# Patient Record
Sex: Female | Born: 2000
Health system: Southern US, Community
[De-identification: ages and names within clinical notes are randomized; demographics above are authoritative.]

## PROBLEM LIST (undated history)

## (undated) DIAGNOSIS — J3501 Chronic tonsillitis: Secondary | ICD-10-CM

## (undated) DIAGNOSIS — T7840XA Allergy, unspecified, initial encounter: Secondary | ICD-10-CM

## (undated) DIAGNOSIS — T8859XA Other complications of anesthesia, initial encounter: Secondary | ICD-10-CM

## (undated) DIAGNOSIS — F32A Depression, unspecified: Secondary | ICD-10-CM

## (undated) DIAGNOSIS — T4145XA Adverse effect of unspecified anesthetic, initial encounter: Secondary | ICD-10-CM

## (undated) DIAGNOSIS — Z8489 Family history of other specified conditions: Secondary | ICD-10-CM

## (undated) DIAGNOSIS — F419 Anxiety disorder, unspecified: Secondary | ICD-10-CM

## (undated) HISTORY — DX: Anxiety disorder, unspecified: F41.9

## (undated) HISTORY — DX: Depression, unspecified: F32.A

## (undated) HISTORY — PX: WISDOM TOOTH EXTRACTION: SHX21

## (undated) HISTORY — DX: Allergy, unspecified, initial encounter: T78.40XA

---
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
## 2020-01-07 DIAGNOSIS — J3489 Other specified disorders of nose and nasal sinuses: Secondary | ICD-10-CM | POA: Diagnosis not present

## 2020-01-07 DIAGNOSIS — R519 Headache, unspecified: Secondary | ICD-10-CM | POA: Diagnosis not present

## 2020-01-07 DIAGNOSIS — J029 Acute pharyngitis, unspecified: Secondary | ICD-10-CM | POA: Diagnosis not present

## 2020-01-14 DIAGNOSIS — J301 Allergic rhinitis due to pollen: Secondary | ICD-10-CM | POA: Diagnosis not present

## 2020-01-21 DIAGNOSIS — J301 Allergic rhinitis due to pollen: Secondary | ICD-10-CM | POA: Diagnosis not present

## 2020-01-25 DIAGNOSIS — J029 Acute pharyngitis, unspecified: Secondary | ICD-10-CM | POA: Diagnosis not present

## 2020-02-04 DIAGNOSIS — J301 Allergic rhinitis due to pollen: Secondary | ICD-10-CM | POA: Diagnosis not present

## 2020-02-18 DIAGNOSIS — J301 Allergic rhinitis due to pollen: Secondary | ICD-10-CM | POA: Diagnosis not present

## 2020-03-01 DIAGNOSIS — J301 Allergic rhinitis due to pollen: Secondary | ICD-10-CM | POA: Diagnosis not present

## 2020-03-01 DIAGNOSIS — J019 Acute sinusitis, unspecified: Secondary | ICD-10-CM | POA: Diagnosis not present

## 2020-03-03 DIAGNOSIS — J301 Allergic rhinitis due to pollen: Secondary | ICD-10-CM | POA: Diagnosis not present

## 2020-03-21 DIAGNOSIS — H6593 Unspecified nonsuppurative otitis media, bilateral: Secondary | ICD-10-CM | POA: Diagnosis not present

## 2020-03-24 DIAGNOSIS — J301 Allergic rhinitis due to pollen: Secondary | ICD-10-CM | POA: Diagnosis not present

## 2020-03-31 DIAGNOSIS — J301 Allergic rhinitis due to pollen: Secondary | ICD-10-CM | POA: Diagnosis not present

## 2020-04-01 DIAGNOSIS — H9203 Otalgia, bilateral: Secondary | ICD-10-CM | POA: Diagnosis not present

## 2020-04-01 DIAGNOSIS — H6593 Unspecified nonsuppurative otitis media, bilateral: Secondary | ICD-10-CM | POA: Diagnosis not present

## 2020-04-07 DIAGNOSIS — R399 Unspecified symptoms and signs involving the genitourinary system: Secondary | ICD-10-CM | POA: Diagnosis not present

## 2020-04-19 DIAGNOSIS — M549 Dorsalgia, unspecified: Secondary | ICD-10-CM | POA: Diagnosis not present

## 2020-04-19 DIAGNOSIS — R81 Glycosuria: Secondary | ICD-10-CM | POA: Diagnosis not present

## 2020-04-28 DIAGNOSIS — J301 Allergic rhinitis due to pollen: Secondary | ICD-10-CM | POA: Diagnosis not present

## 2020-05-03 DIAGNOSIS — Z87440 Personal history of urinary (tract) infections: Secondary | ICD-10-CM | POA: Diagnosis not present

## 2020-05-05 DIAGNOSIS — J301 Allergic rhinitis due to pollen: Secondary | ICD-10-CM | POA: Diagnosis not present

## 2020-05-17 DIAGNOSIS — R300 Dysuria: Secondary | ICD-10-CM | POA: Diagnosis not present

## 2020-05-19 DIAGNOSIS — J301 Allergic rhinitis due to pollen: Secondary | ICD-10-CM | POA: Diagnosis not present

## 2020-05-20 DIAGNOSIS — J301 Allergic rhinitis due to pollen: Secondary | ICD-10-CM | POA: Diagnosis not present

## 2020-05-24 DIAGNOSIS — N390 Urinary tract infection, site not specified: Secondary | ICD-10-CM | POA: Diagnosis not present

## 2020-05-24 DIAGNOSIS — Z79899 Other long term (current) drug therapy: Secondary | ICD-10-CM | POA: Diagnosis not present

## 2020-05-24 DIAGNOSIS — R102 Pelvic and perineal pain: Secondary | ICD-10-CM | POA: Diagnosis not present

## 2020-05-26 ENCOUNTER — Ambulatory Visit: Payer: Self-pay

## 2020-05-26 DIAGNOSIS — J301 Allergic rhinitis due to pollen: Secondary | ICD-10-CM | POA: Diagnosis not present

## 2020-05-26 DIAGNOSIS — N390 Urinary tract infection, site not specified: Secondary | ICD-10-CM | POA: Diagnosis not present

## 2020-06-02 DIAGNOSIS — J301 Allergic rhinitis due to pollen: Secondary | ICD-10-CM | POA: Diagnosis not present

## 2020-06-17 DIAGNOSIS — J0100 Acute maxillary sinusitis, unspecified: Secondary | ICD-10-CM | POA: Diagnosis not present

## 2020-06-21 DIAGNOSIS — N946 Dysmenorrhea, unspecified: Secondary | ICD-10-CM | POA: Diagnosis not present

## 2020-06-23 DIAGNOSIS — J301 Allergic rhinitis due to pollen: Secondary | ICD-10-CM | POA: Diagnosis not present

## 2020-06-27 DIAGNOSIS — J309 Allergic rhinitis, unspecified: Secondary | ICD-10-CM | POA: Diagnosis not present

## 2020-07-05 DIAGNOSIS — H6591 Unspecified nonsuppurative otitis media, right ear: Secondary | ICD-10-CM | POA: Diagnosis not present

## 2020-07-05 DIAGNOSIS — J310 Chronic rhinitis: Secondary | ICD-10-CM | POA: Diagnosis not present

## 2020-07-05 DIAGNOSIS — J029 Acute pharyngitis, unspecified: Secondary | ICD-10-CM | POA: Diagnosis not present

## 2020-07-07 DIAGNOSIS — J301 Allergic rhinitis due to pollen: Secondary | ICD-10-CM | POA: Diagnosis not present

## 2020-07-08 DIAGNOSIS — H5213 Myopia, bilateral: Secondary | ICD-10-CM | POA: Diagnosis not present

## 2020-07-14 DIAGNOSIS — J301 Allergic rhinitis due to pollen: Secondary | ICD-10-CM | POA: Diagnosis not present

## 2020-07-21 DIAGNOSIS — J312 Chronic pharyngitis: Secondary | ICD-10-CM | POA: Diagnosis not present

## 2020-07-21 DIAGNOSIS — R070 Pain in throat: Secondary | ICD-10-CM | POA: Diagnosis not present

## 2020-07-27 DIAGNOSIS — J329 Chronic sinusitis, unspecified: Secondary | ICD-10-CM | POA: Diagnosis not present

## 2020-07-28 DIAGNOSIS — J301 Allergic rhinitis due to pollen: Secondary | ICD-10-CM | POA: Diagnosis not present

## 2020-08-01 DIAGNOSIS — H6981 Other specified disorders of Eustachian tube, right ear: Secondary | ICD-10-CM | POA: Diagnosis not present

## 2020-08-04 DIAGNOSIS — J301 Allergic rhinitis due to pollen: Secondary | ICD-10-CM | POA: Diagnosis not present

## 2020-08-16 DIAGNOSIS — J301 Allergic rhinitis due to pollen: Secondary | ICD-10-CM | POA: Diagnosis not present

## 2020-08-18 DIAGNOSIS — J301 Allergic rhinitis due to pollen: Secondary | ICD-10-CM | POA: Diagnosis not present

## 2020-08-19 DIAGNOSIS — R300 Dysuria: Secondary | ICD-10-CM | POA: Diagnosis not present

## 2020-08-19 DIAGNOSIS — R102 Pelvic and perineal pain: Secondary | ICD-10-CM | POA: Diagnosis not present

## 2020-08-19 DIAGNOSIS — J029 Acute pharyngitis, unspecified: Secondary | ICD-10-CM | POA: Diagnosis not present

## 2020-08-19 DIAGNOSIS — J3489 Other specified disorders of nose and nasal sinuses: Secondary | ICD-10-CM | POA: Diagnosis not present

## 2020-09-08 DIAGNOSIS — J301 Allergic rhinitis due to pollen: Secondary | ICD-10-CM | POA: Diagnosis not present

## 2020-09-15 DIAGNOSIS — J301 Allergic rhinitis due to pollen: Secondary | ICD-10-CM | POA: Diagnosis not present

## 2020-09-22 DIAGNOSIS — J301 Allergic rhinitis due to pollen: Secondary | ICD-10-CM | POA: Diagnosis not present

## 2020-10-03 DIAGNOSIS — F419 Anxiety disorder, unspecified: Secondary | ICD-10-CM | POA: Diagnosis not present

## 2020-10-03 DIAGNOSIS — J019 Acute sinusitis, unspecified: Secondary | ICD-10-CM | POA: Diagnosis not present

## 2020-10-13 DIAGNOSIS — J301 Allergic rhinitis due to pollen: Secondary | ICD-10-CM | POA: Diagnosis not present

## 2020-10-25 ENCOUNTER — Ambulatory Visit: Payer: BC Managed Care – PPO | Admitting: Internal Medicine

## 2020-10-25 ENCOUNTER — Encounter: Payer: Self-pay | Admitting: Internal Medicine

## 2020-10-25 ENCOUNTER — Other Ambulatory Visit: Payer: Self-pay

## 2020-10-25 DIAGNOSIS — J309 Allergic rhinitis, unspecified: Secondary | ICD-10-CM | POA: Diagnosis not present

## 2020-10-25 DIAGNOSIS — Z8709 Personal history of other diseases of the respiratory system: Secondary | ICD-10-CM | POA: Diagnosis not present

## 2020-10-25 DIAGNOSIS — Z9089 Acquired absence of other organs: Secondary | ICD-10-CM

## 2020-10-25 NOTE — Progress Notes (Signed)
[facility]  Reason for Consult: Recurrent sore throat and colonization with Pseudomonas aeruginosa Referring Provider: Dr. Erline Hau  Assessment: She has a fairly long history of recurrent sore throat that appears to have been very mild and without significant complications.  She has now colonized with Pseudomonas, most likely due to frequent antibiotic exposure in the past.  I told them that I think this is a case of true, true, unrelated.  She has Pseudomonas in her upper airway but I do not think it is likely to be the cause of her sore throat.  I recommend against future throat cultures unless there was some sign of complicated pharyngitis.  I do not feel that she needs any more treatment for Pseudomonas.  I told her that I am perfectly comfortable completely ignoring Pseudomonas.  She said that she felt a little less anxious following our discussion.  She and her mother are in agreement with this plan.  Plan: No need for further testing or treatment of Pseudomonas  Patient Active Problem List   Diagnosis Date Noted   History of sore throat 10/25/2020    Priority: High   Allergic rhinitis 10/25/2020   Status post tonsillectomy 10/25/2020    Patient's Medications  New Prescriptions   No medications on file  Previous Medications   CETIRIZINE (ZYRTEC ALLERGY) 10 MG TABLET       FLUTICASONE (FLONASE) 50 MCG/ACT NASAL SPRAY    Place 1 spray into both nostrils daily.    METH-HYO-M BL-NA PHOS-PH SAL (URIBEL) 118 MG CAPS    Take 1 capsule (118 mg total) by mouth 4 (four) times daily as needed (dysuria).   MONTELUKAST (SINGULAIR) 10 MG TABLET    Take 10 mg by mouth at bedtime.   OXYBUTYNIN (DITROPAN) 5 MG TABLET    Take 1 tablet (5 mg total) by mouth every 8 (eight) hours as needed for bladder spasms (urinary frequency).   PROBIOTIC PRODUCT (PROBIOTIC PO)    Take by mouth daily.  Modified Medications   No medications on file  Discontinued  Medications   No medications on file    HPI: Erica Scott is a 20 y.o. female with a history of seasonal allergies and frequent sore throat.  She is in with her mother today.  He tells me that starting as a young teenager she had frequent sore throat.  Her mother says that she frequently had throat cultures that were positive for group B strep.  She was frequently treated with amoxicillin clavulanate.  She was finally referred to Dr. Jenne Campus who performed a tonsillectomy in 2019.  They feel that she had definite improvement in her sore throat until last November.  She has had multiple episodes of sore throat since that time.  She saw her PCP last November who did a throat culture which grew Pseudomonas aeruginosa susceptible to all antibiotics tested.  She was treated with ciprofloxacin.  She believes that she had some improvement.  She also tells me that she will frequently has sore throat that she thinks is related to her allergies.  She was seen again by her PCP in April.  Repeat throat culture grew Pseudomonas again and she was treated with a second round of ciprofloxacin.  He saw Dr. Jenne Campus in May.  Her pharyngeal exam was normal at that time.  No acute abnormalities were noted on sinus CT scan.  She was seen by her PCP again last month and a throat  culture grew Pseudomonas a third time.  She just completed ciprofloxacin 2 days ago.  She has had diarrhea and upset stomach each time she was treated with ciprofloxacin.  Review of Systems: Review of Systems  Constitutional:  Negative for chills, diaphoresis and fever.  HENT:  Positive for congestion. Negative for ear pain, sinus pain and sore throat.   Respiratory:  Negative for cough, sputum production and shortness of breath.   Gastrointestinal:  Negative for abdominal pain, diarrhea, nausea and vomiting.  Skin:  Negative for rash.  Psychiatric/Behavioral:  The patient is nervous/anxious.      Past Medical History:  Diagnosis Date    Chronic tonsillitis    Complication of anesthesia    Family history of adverse reaction to anesthesia     Social History   Tobacco Use   Smoking status: Never   Smokeless tobacco: Never  Vaping Use   Vaping Use: Never used  Substance Use Topics   Alcohol use: Never   Drug use: Never    Family History  Problem Relation Age of Onset   Prostate cancer Neg Hx    Bladder Cancer Neg Hx    Kidney cancer Neg Hx    No Known Allergies  OBJECTIVE: Vitals:   10/25/20 1541  BP: 112/78  Pulse: (!) 114  Temp: 98 F (36.7 C)  TempSrc: Oral  SpO2: 98%  Weight: 216 lb (98 kg)   Body mass index is 34.86 kg/m.   Physical Exam Constitutional:      Comments: As soon as I walked in the room she told me that she was quite anxious.  She says that she is worried that I was going to give her bad news.  HENT:     Nose: Nose normal.     Mouth/Throat:     Mouth: Mucous membranes are moist.     Pharynx: Oropharynx is clear. No oropharyngeal exudate or posterior oropharyngeal erythema.  Cardiovascular:     Rate and Rhythm: Normal rate and regular rhythm.     Heart sounds: No murmur heard. Pulmonary:     Effort: Pulmonary effort is normal.     Breath sounds: Normal breath sounds. No wheezing, rhonchi or rales.  Lymphadenopathy:     Head:     Right side of head: No submental, submandibular, preauricular or posterior auricular adenopathy.     Left side of head: No submental, submandibular, preauricular or posterior auricular adenopathy.     Cervical: No cervical adenopathy.    Microbiology: No results found for this or any previous visit (from the past 240 hour(s)).  Cliffton Asters, MD [facility] for Infectious Disease [facility] [phone] pager   [phone] cell 10/25/2020, 4:37 PM

## 2020-11-03 DIAGNOSIS — J301 Allergic rhinitis due to pollen: Secondary | ICD-10-CM | POA: Diagnosis not present

## 2020-11-08 ENCOUNTER — Ambulatory Visit: Payer: BC Managed Care – PPO | Admitting: Internal Medicine

## 2020-11-08 DIAGNOSIS — J301 Allergic rhinitis due to pollen: Secondary | ICD-10-CM | POA: Diagnosis not present

## 2020-11-10 DIAGNOSIS — J301 Allergic rhinitis due to pollen: Secondary | ICD-10-CM | POA: Diagnosis not present

## 2020-11-24 DIAGNOSIS — J301 Allergic rhinitis due to pollen: Secondary | ICD-10-CM | POA: Diagnosis not present

## 2020-12-05 DIAGNOSIS — R350 Frequency of micturition: Secondary | ICD-10-CM | POA: Diagnosis not present

## 2020-12-05 DIAGNOSIS — J029 Acute pharyngitis, unspecified: Secondary | ICD-10-CM | POA: Diagnosis not present

## 2020-12-08 DIAGNOSIS — J301 Allergic rhinitis due to pollen: Secondary | ICD-10-CM | POA: Diagnosis not present

## 2020-12-29 DIAGNOSIS — J301 Allergic rhinitis due to pollen: Secondary | ICD-10-CM | POA: Diagnosis not present

## 2021-01-05 DIAGNOSIS — J301 Allergic rhinitis due to pollen: Secondary | ICD-10-CM | POA: Diagnosis not present

## 2021-01-12 DIAGNOSIS — J301 Allergic rhinitis due to pollen: Secondary | ICD-10-CM | POA: Diagnosis not present

## 2021-01-26 DIAGNOSIS — J301 Allergic rhinitis due to pollen: Secondary | ICD-10-CM | POA: Diagnosis not present

## 2021-02-02 DIAGNOSIS — J301 Allergic rhinitis due to pollen: Secondary | ICD-10-CM | POA: Diagnosis not present

## 2021-02-03 DIAGNOSIS — J309 Allergic rhinitis, unspecified: Secondary | ICD-10-CM | POA: Diagnosis not present

## 2021-02-03 DIAGNOSIS — J301 Allergic rhinitis due to pollen: Secondary | ICD-10-CM | POA: Diagnosis not present

## 2021-02-03 DIAGNOSIS — R070 Pain in throat: Secondary | ICD-10-CM | POA: Diagnosis not present

## 2021-02-16 DIAGNOSIS — J301 Allergic rhinitis due to pollen: Secondary | ICD-10-CM | POA: Diagnosis not present

## 2021-02-23 DIAGNOSIS — J301 Allergic rhinitis due to pollen: Secondary | ICD-10-CM | POA: Diagnosis not present

## 2021-03-06 DIAGNOSIS — J019 Acute sinusitis, unspecified: Secondary | ICD-10-CM | POA: Diagnosis not present

## 2021-03-06 DIAGNOSIS — J309 Allergic rhinitis, unspecified: Secondary | ICD-10-CM | POA: Diagnosis not present

## 2021-03-16 DIAGNOSIS — J301 Allergic rhinitis due to pollen: Secondary | ICD-10-CM | POA: Diagnosis not present

## 2021-03-23 DIAGNOSIS — J301 Allergic rhinitis due to pollen: Secondary | ICD-10-CM | POA: Diagnosis not present

## 2021-04-03 DIAGNOSIS — J301 Allergic rhinitis due to pollen: Secondary | ICD-10-CM | POA: Diagnosis not present

## 2021-04-14 DIAGNOSIS — Z111 Encounter for screening for respiratory tuberculosis: Secondary | ICD-10-CM | POA: Diagnosis not present

## 2021-04-17 DIAGNOSIS — Z111 Encounter for screening for respiratory tuberculosis: Secondary | ICD-10-CM | POA: Diagnosis not present

## 2021-04-19 DIAGNOSIS — J029 Acute pharyngitis, unspecified: Secondary | ICD-10-CM | POA: Diagnosis not present

## 2021-04-21 DIAGNOSIS — M542 Cervicalgia: Secondary | ICD-10-CM | POA: Diagnosis not present

## 2021-04-21 DIAGNOSIS — J019 Acute sinusitis, unspecified: Secondary | ICD-10-CM | POA: Diagnosis not present

## 2021-04-21 DIAGNOSIS — J301 Allergic rhinitis due to pollen: Secondary | ICD-10-CM | POA: Diagnosis not present

## 2021-04-24 DIAGNOSIS — Z111 Encounter for screening for respiratory tuberculosis: Secondary | ICD-10-CM | POA: Diagnosis not present

## 2021-04-24 DIAGNOSIS — J301 Allergic rhinitis due to pollen: Secondary | ICD-10-CM | POA: Diagnosis not present

## 2021-04-26 DIAGNOSIS — Z111 Encounter for screening for respiratory tuberculosis: Secondary | ICD-10-CM | POA: Diagnosis not present

## 2021-05-01 DIAGNOSIS — J301 Allergic rhinitis due to pollen: Secondary | ICD-10-CM | POA: Diagnosis not present

## 2021-05-08 IMAGING — RF DG UGI W/ HIGH DENSITY W/O KUB
12 of 16 series · 15 of 24 positions shown · non-contrast
Comparison: None.

CLINICAL DATA: Chest pain.

EXAM:
UPPER GI SERIES WITHOUT KUB
TECHNIQUE: Routine upper GI series was performed with thin and thick barium.
FLUOROSCOPY TIME:  Fluoroscopy Time:  2 minutes and 36 seconds
Number of Acquired Spot Images: 0

[Series 1: cp_standard · 0.26mm/px · 1 of 92 frames shown (1 of 12)]
[frame 14/92]
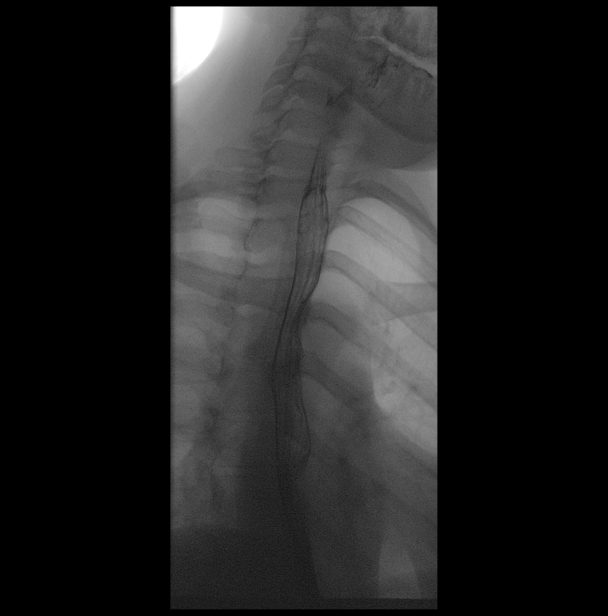

[Series 2: cp_standard · 0.26mm/px · 1 of 51 frames shown (2 of 12)]
[frame 3/51]
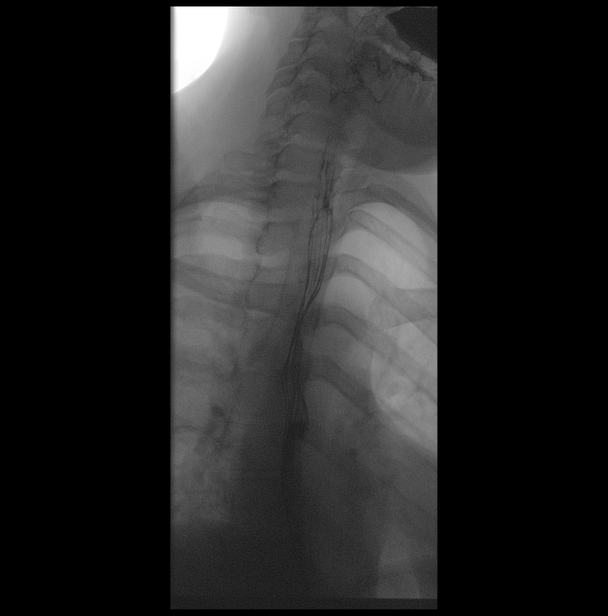

[Series 3: cp_standard · 0.26mm/px · 2 of 74 frames shown (3 of 12)]
[frame 12/74]
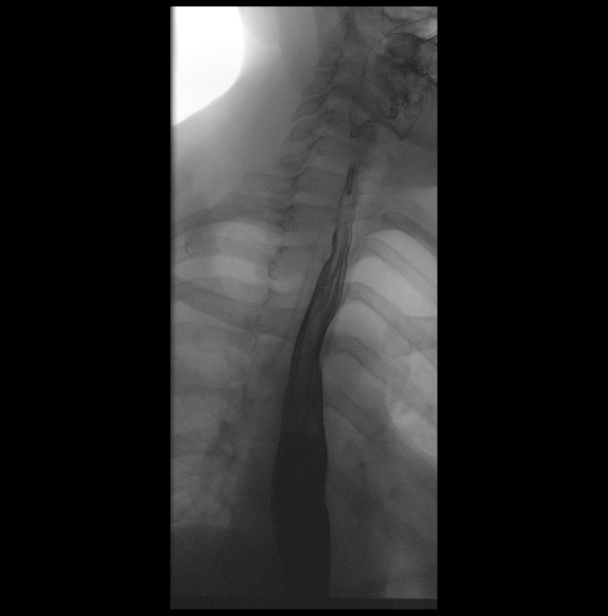
[frame 38/74]
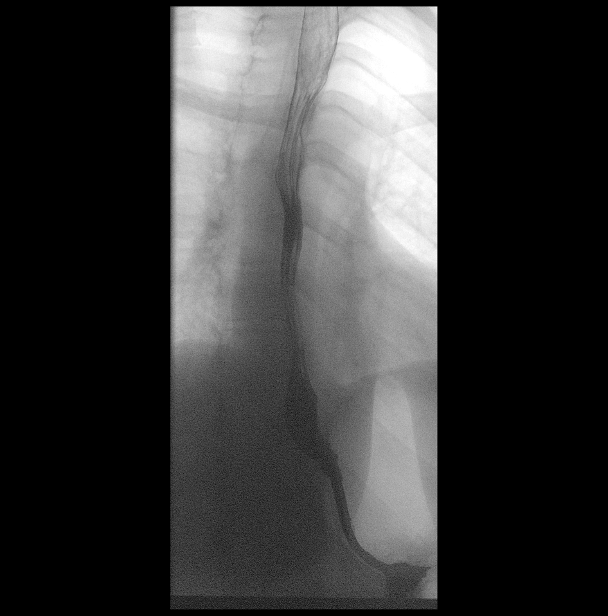

[Series 4: cp_standard · 0.26mm/px · 2 of 65 frames shown (4 of 12)]
[frame 29/65]
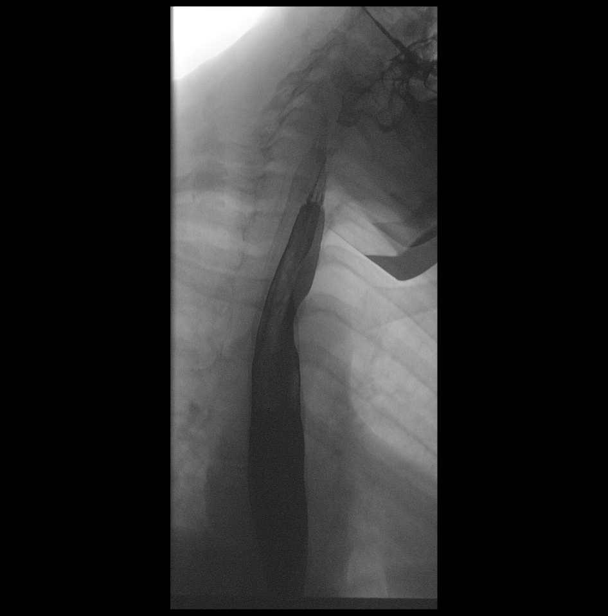
[frame 56/65]
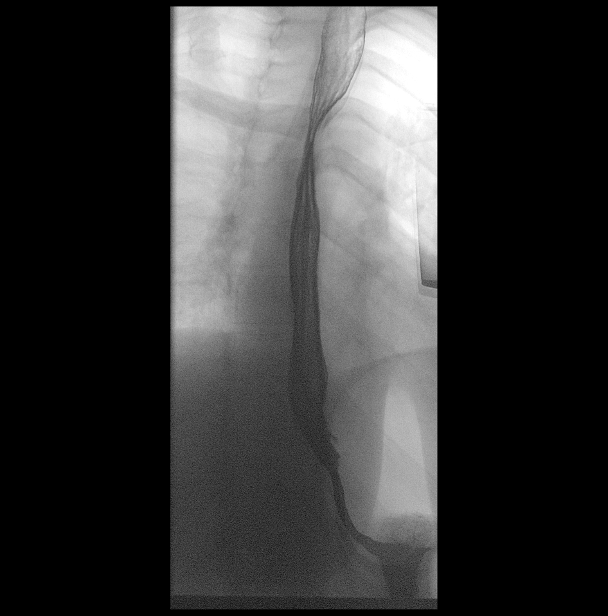

[Series 5: cp_standard · 0.26mm/px · 1 of 35 frames shown (5 of 12)]
[frame 30/35]
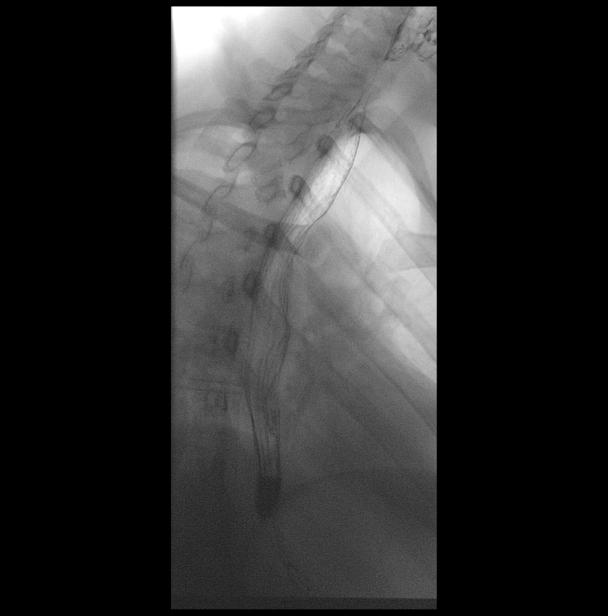

[Series 9: cp_standard · 0.26mm/px · 1 of 1 slices shown (6 of 12)]
[im 1/1]
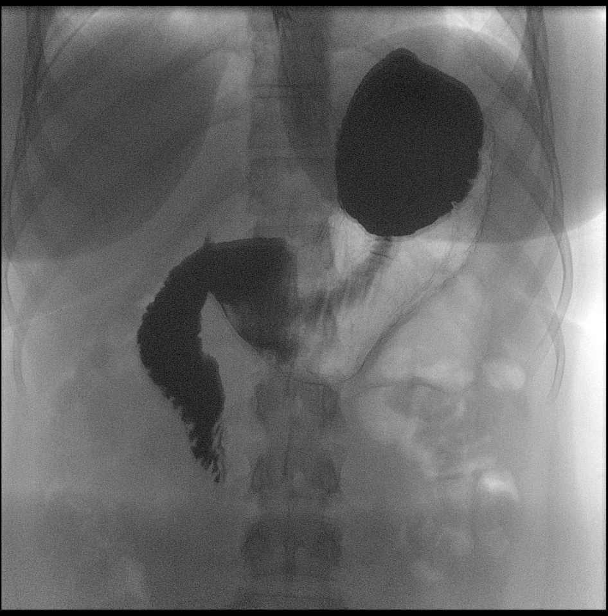

[Series 11: cp_standard · 0.26mm/px · 1 of 1 slices shown (7 of 12)]
[im 1/1]
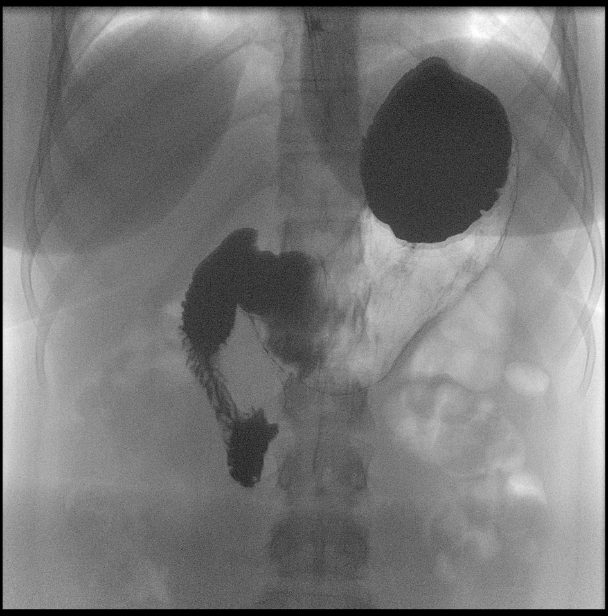

[Series 15: cp_standard · 0.26mm/px · 1 of 1 slices shown (8 of 12)]
[im 1/1]
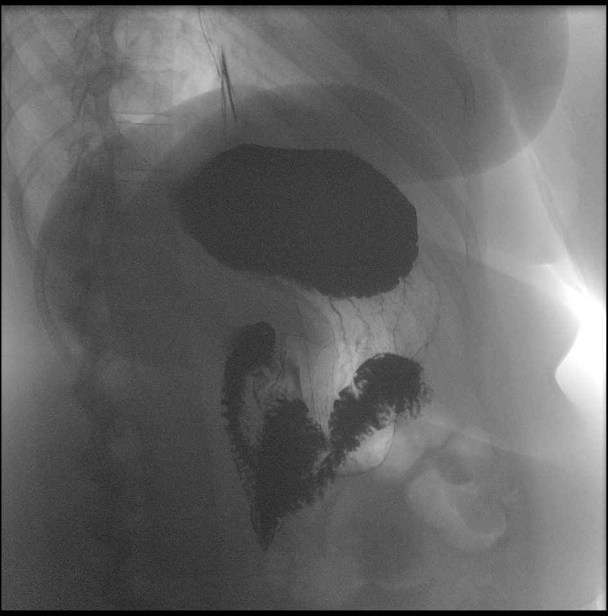

[Series 17: cp_standard · 0.26mm/px · 1 of 1 slices shown (9 of 12)]
[im 1/1]
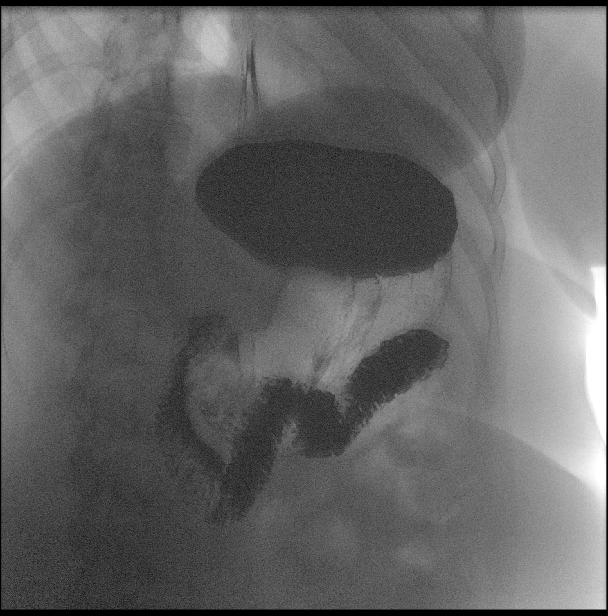

[Series 20: cp_standard · 0.26mm/px · 1 of 1 slices shown (10 of 12)]
[im 1/1]
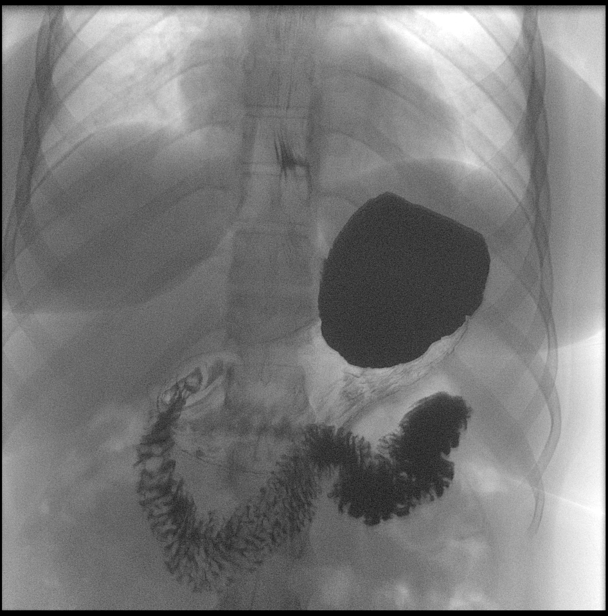

[Series 22: cp_standard · 0.27mm/px · 2 of 109 frames shown (11 of 12)]
[frame 17/109]
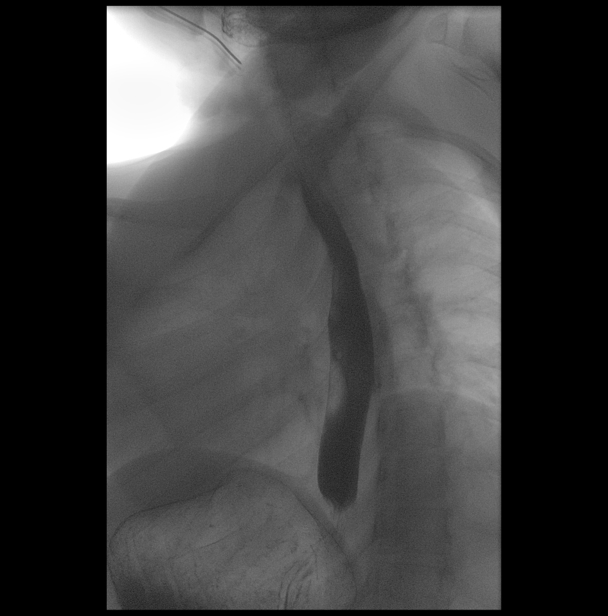
[frame 55/109]
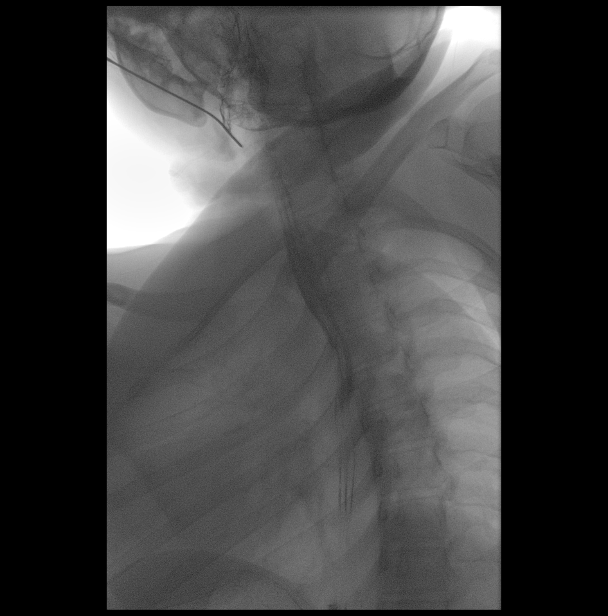

[Series 23: cp_standard · 0.27mm/px · 1 of 95 frames shown (12 of 12)]
[frame 81/95]
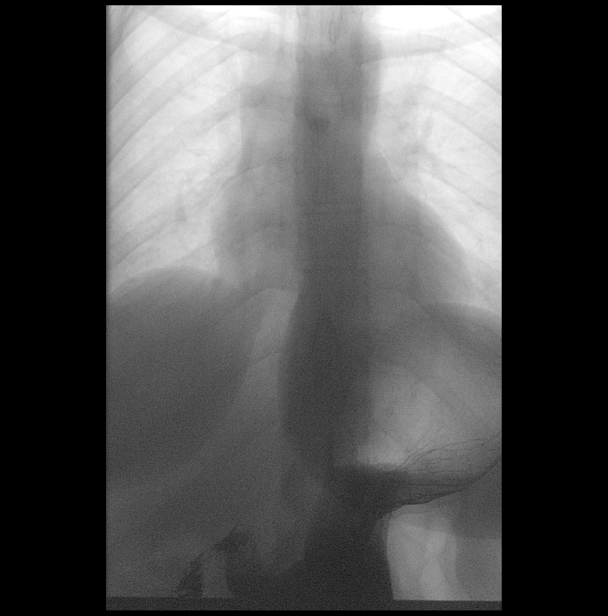

[15 of 24 positions shown; findings below may reference images not displayed]

FINDINGS: The esophagus is normal in appearance. Normal peristalsis. No
masses, strictures, or ulceration identified.

The stomach is normal in appearance with no masses, strictures, or
ulceration. Normal peristalsis is appreciated.

The duodenum is normal in appearance. Specifically, the duodenal
bulb is normal and the duodenal sweep appears as expected.

The barium tablet passed normally.
IMPRESSION: Normal study.

## 2021-05-15 DIAGNOSIS — J329 Chronic sinusitis, unspecified: Secondary | ICD-10-CM | POA: Diagnosis not present

## 2021-05-15 DIAGNOSIS — H65191 Other acute nonsuppurative otitis media, right ear: Secondary | ICD-10-CM | POA: Diagnosis not present

## 2021-05-22 DIAGNOSIS — J301 Allergic rhinitis due to pollen: Secondary | ICD-10-CM | POA: Diagnosis not present

## 2021-05-29 DIAGNOSIS — J301 Allergic rhinitis due to pollen: Secondary | ICD-10-CM | POA: Diagnosis not present

## 2021-06-05 DIAGNOSIS — J301 Allergic rhinitis due to pollen: Secondary | ICD-10-CM | POA: Diagnosis not present

## 2021-06-12 DIAGNOSIS — H60311 Diffuse otitis externa, right ear: Secondary | ICD-10-CM | POA: Diagnosis not present

## 2021-06-14 DIAGNOSIS — H5213 Myopia, bilateral: Secondary | ICD-10-CM | POA: Diagnosis not present

## 2021-06-19 DIAGNOSIS — J301 Allergic rhinitis due to pollen: Secondary | ICD-10-CM | POA: Diagnosis not present

## 2021-07-03 DIAGNOSIS — J301 Allergic rhinitis due to pollen: Secondary | ICD-10-CM | POA: Diagnosis not present

## 2021-07-14 DIAGNOSIS — J301 Allergic rhinitis due to pollen: Secondary | ICD-10-CM | POA: Diagnosis not present

## 2021-07-17 DIAGNOSIS — J301 Allergic rhinitis due to pollen: Secondary | ICD-10-CM | POA: Diagnosis not present

## 2021-07-24 DIAGNOSIS — J301 Allergic rhinitis due to pollen: Secondary | ICD-10-CM | POA: Diagnosis not present

## 2021-07-31 DIAGNOSIS — J301 Allergic rhinitis due to pollen: Secondary | ICD-10-CM | POA: Diagnosis not present

## 2021-08-07 DIAGNOSIS — J301 Allergic rhinitis due to pollen: Secondary | ICD-10-CM | POA: Diagnosis not present

## 2021-08-16 DIAGNOSIS — R300 Dysuria: Secondary | ICD-10-CM | POA: Diagnosis not present

## 2021-08-16 DIAGNOSIS — H65191 Other acute nonsuppurative otitis media, right ear: Secondary | ICD-10-CM | POA: Diagnosis not present

## 2021-08-30 ENCOUNTER — Ambulatory Visit: Payer: BC Managed Care – PPO | Admitting: Internal Medicine

## 2021-09-04 DIAGNOSIS — J301 Allergic rhinitis due to pollen: Secondary | ICD-10-CM | POA: Diagnosis not present

## 2021-09-08 DIAGNOSIS — R300 Dysuria: Secondary | ICD-10-CM | POA: Diagnosis not present

## 2021-09-08 DIAGNOSIS — H9201 Otalgia, right ear: Secondary | ICD-10-CM | POA: Diagnosis not present

## 2021-09-11 DIAGNOSIS — J301 Allergic rhinitis due to pollen: Secondary | ICD-10-CM | POA: Diagnosis not present

## 2021-09-25 DIAGNOSIS — R300 Dysuria: Secondary | ICD-10-CM | POA: Diagnosis not present

## 2021-10-02 ENCOUNTER — Telehealth: Payer: Self-pay

## 2021-10-02 ENCOUNTER — Ambulatory Visit: Payer: BC Managed Care – PPO | Admitting: Internal Medicine

## 2021-10-02 DIAGNOSIS — J301 Allergic rhinitis due to pollen: Secondary | ICD-10-CM | POA: Diagnosis not present

## 2021-10-02 NOTE — Progress Notes (Deleted)
          Patient Active Problem List   Diagnosis Date Noted   Allergic rhinitis 10/25/2020   History of sore throat 10/25/2020   Status post tonsillectomy 10/25/2020    Patient's Medications  New Prescriptions   No medications on file  Previous Medications   CETIRIZINE (ZYRTEC ALLERGY) 10 MG TABLET       FLUTICASONE (FLONASE) 50 MCG/ACT NASAL SPRAY    Place 1 spray into both nostrils daily.    METH-HYO-M BL-NA PHOS-PH SAL (URIBEL) 118 MG CAPS    Take 1 capsule (118 mg total) by mouth 4 (four) times daily as needed (dysuria).   MONTELUKAST (SINGULAIR) 10 MG TABLET    Take 10 mg by mouth at bedtime.   OXYBUTYNIN (DITROPAN) 5 MG TABLET    Take 1 tablet (5 mg total) by mouth every 8 (eight) hours as needed for bladder spasms (urinary frequency).   PROBIOTIC PRODUCT (PROBIOTIC PO)    Take by mouth daily.  Modified Medications   No medications on file  Discontinued Medications   No medications on file    Subjective: 21 year old female followed by Dr. 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  She has had sore throat starting as a teenager.  Frequently positive for group B strep.  Treated with Augmentin.  She was referred to Dr. Jenne Campus in 2019 for tonsillectomy.  She had improvement in sore throat symptoms until November 2021 when she had multiple episodes of sore throat.  She was administered about 3 rounds of ciprofloxacin for throat cultures growing Pseudomonas.  With her last round in August 2022 she developed diarrhea and upset stomach.  Counseled that her throat is likely colonized and as such does not need any further throat culture/treatment unless she has signs of complicated pharyngitis.   Review of Systems: ROS  Past Medical History:  Diagnosis Date   Chronic tonsillitis    Complication of anesthesia    Family history of adverse reaction to anesthesia     Social History   Tobacco Use   Smoking status: Never    Smokeless tobacco: Never  Vaping Use   Vaping Use: Never used  Substance Use Topics   Alcohol use: Never   Drug use: Never    Family History  Problem Relation Age of Onset   Prostate cancer Neg Hx    Bladder Cancer Neg Hx    Kidney cancer Neg Hx     No Known Allergies  Health Maintenance  Topic Date Due   HPV VACCINES (1 - 2-dose series) Never done   HIV Screening  Never done   Hepatitis C Screening  Never done   TETANUS/TDAP  Never done   COVID-19 Vaccine (5 - Pfizer series) 03/07/2020   INFLUENZA VACCINE  10/17/2021    Objective:  There were no vitals filed for this visit. There is no height or weight on file to calculate BMI.  Physical Exam  Lab Results No results found for: "WBC", "HGB", "HCT", "MCV", "PLT" No results found for: "CREATININE", "BUN", "NA", "K", "CL", "CO2" No results found for: "ALT", "AST", "GGT", "ALKPHOS", "BILITOT"  No results found for: "CHOL", "HDL", "LDLCALC", "LDLDIRECT", "TRIG", "CHOLHDL" No results found for: "LABRPR", "RPRTITER" No results found for: "HIV1RNAQUANT", "HIV1RNAVL", "CD4TABS"   #Sore throat #PsA colonization    Danelle Earthly, MD [facility] 10/02/2021, 1:17 PM

## 2021-10-02 NOTE — Telephone Encounter (Signed)
Called patient to reschedule missed appointment. No answer. Left HIPAA-compliant voicemail requesting call back. Will also send MyChart message.  Eldwin Volkov E Delsa Walder, RN  

## 2021-10-09 DIAGNOSIS — J301 Allergic rhinitis due to pollen: Secondary | ICD-10-CM | POA: Diagnosis not present

## 2021-10-16 DIAGNOSIS — J301 Allergic rhinitis due to pollen: Secondary | ICD-10-CM | POA: Diagnosis not present

## 2021-10-18 DIAGNOSIS — H5213 Myopia, bilateral: Secondary | ICD-10-CM | POA: Diagnosis not present

## 2021-10-23 DIAGNOSIS — J301 Allergic rhinitis due to pollen: Secondary | ICD-10-CM | POA: Diagnosis not present

## 2021-10-30 DIAGNOSIS — J301 Allergic rhinitis due to pollen: Secondary | ICD-10-CM | POA: Diagnosis not present

## 2021-11-06 DIAGNOSIS — R300 Dysuria: Secondary | ICD-10-CM | POA: Diagnosis not present

## 2021-11-13 DIAGNOSIS — J301 Allergic rhinitis due to pollen: Secondary | ICD-10-CM | POA: Diagnosis not present

## 2021-11-21 DIAGNOSIS — N946 Dysmenorrhea, unspecified: Secondary | ICD-10-CM | POA: Diagnosis not present

## 2021-11-21 DIAGNOSIS — R300 Dysuria: Secondary | ICD-10-CM | POA: Diagnosis not present

## 2021-11-27 DIAGNOSIS — J301 Allergic rhinitis due to pollen: Secondary | ICD-10-CM | POA: Diagnosis not present

## 2021-12-11 DIAGNOSIS — R300 Dysuria: Secondary | ICD-10-CM | POA: Diagnosis not present

## 2021-12-11 DIAGNOSIS — J301 Allergic rhinitis due to pollen: Secondary | ICD-10-CM | POA: Diagnosis not present

## 2021-12-18 DIAGNOSIS — J301 Allergic rhinitis due to pollen: Secondary | ICD-10-CM | POA: Diagnosis not present

## 2021-12-25 DIAGNOSIS — J301 Allergic rhinitis due to pollen: Secondary | ICD-10-CM | POA: Diagnosis not present

## 2021-12-27 DIAGNOSIS — J301 Allergic rhinitis due to pollen: Secondary | ICD-10-CM | POA: Diagnosis not present

## 2021-12-29 DIAGNOSIS — Z6831 Body mass index (BMI) 31.0-31.9, adult: Secondary | ICD-10-CM | POA: Diagnosis not present

## 2021-12-29 DIAGNOSIS — R300 Dysuria: Secondary | ICD-10-CM | POA: Diagnosis not present

## 2022-01-05 DIAGNOSIS — N390 Urinary tract infection, site not specified: Secondary | ICD-10-CM | POA: Diagnosis not present

## 2022-01-08 DIAGNOSIS — J301 Allergic rhinitis due to pollen: Secondary | ICD-10-CM | POA: Diagnosis not present

## 2022-01-16 DIAGNOSIS — N6002 Solitary cyst of left breast: Secondary | ICD-10-CM | POA: Diagnosis not present

## 2022-01-16 DIAGNOSIS — R102 Pelvic and perineal pain: Secondary | ICD-10-CM | POA: Diagnosis not present

## 2022-01-16 DIAGNOSIS — N946 Dysmenorrhea, unspecified: Secondary | ICD-10-CM | POA: Diagnosis not present

## 2022-01-16 DIAGNOSIS — N939 Abnormal uterine and vaginal bleeding, unspecified: Secondary | ICD-10-CM | POA: Diagnosis not present

## 2022-01-22 DIAGNOSIS — E119 Type 2 diabetes mellitus without complications: Secondary | ICD-10-CM | POA: Insufficient documentation

## 2022-01-22 DIAGNOSIS — B3731 Acute candidiasis of vulva and vagina: Secondary | ICD-10-CM | POA: Diagnosis not present

## 2022-01-22 DIAGNOSIS — N3000 Acute cystitis without hematuria: Secondary | ICD-10-CM | POA: Diagnosis not present

## 2022-01-29 DIAGNOSIS — J301 Allergic rhinitis due to pollen: Secondary | ICD-10-CM | POA: Diagnosis not present

## 2022-02-01 DIAGNOSIS — N6002 Solitary cyst of left breast: Secondary | ICD-10-CM | POA: Diagnosis not present

## 2022-02-01 DIAGNOSIS — N6322 Unspecified lump in the left breast, upper inner quadrant: Secondary | ICD-10-CM | POA: Diagnosis not present

## 2022-02-05 DIAGNOSIS — N6012 Diffuse cystic mastopathy of left breast: Secondary | ICD-10-CM | POA: Diagnosis not present

## 2022-02-05 DIAGNOSIS — J301 Allergic rhinitis due to pollen: Secondary | ICD-10-CM | POA: Diagnosis not present

## 2022-02-07 DIAGNOSIS — N939 Abnormal uterine and vaginal bleeding, unspecified: Secondary | ICD-10-CM | POA: Diagnosis not present

## 2022-02-07 DIAGNOSIS — N946 Dysmenorrhea, unspecified: Secondary | ICD-10-CM | POA: Diagnosis not present

## 2022-02-12 DIAGNOSIS — J301 Allergic rhinitis due to pollen: Secondary | ICD-10-CM | POA: Diagnosis not present

## 2022-02-19 DIAGNOSIS — N6325 Unspecified lump in the left breast, overlapping quadrants: Secondary | ICD-10-CM | POA: Insufficient documentation

## 2022-02-19 DIAGNOSIS — J302 Other seasonal allergic rhinitis: Secondary | ICD-10-CM | POA: Insufficient documentation

## 2022-02-19 DIAGNOSIS — N946 Dysmenorrhea, unspecified: Secondary | ICD-10-CM | POA: Insufficient documentation

## 2022-02-19 DIAGNOSIS — N6320 Unspecified lump in the left breast, unspecified quadrant: Secondary | ICD-10-CM | POA: Diagnosis not present

## 2022-02-19 DIAGNOSIS — N6322 Unspecified lump in the left breast, upper inner quadrant: Secondary | ICD-10-CM | POA: Diagnosis not present

## 2022-02-27 ENCOUNTER — Ambulatory Visit
Admission: RE | Admit: 2022-02-27 | Discharge: 2022-02-27 | Disposition: A | Discharge status: Home / Self Care | Payer: BC Managed Care – PPO | Source: Ambulatory Visit | Attending: Internal Medicine | Admitting: Internal Medicine

## 2022-02-27 ENCOUNTER — Telehealth: Payer: Self-pay

## 2022-02-27 VITALS — BP 138/84 | HR 118 | Temp 98.4°F | Resp 16 | Ht 67.0 in | Wt 200.0 lb

## 2022-02-27 DIAGNOSIS — R300 Dysuria: Secondary | ICD-10-CM | POA: Diagnosis not present

## 2022-02-27 LAB — URINALYSIS, COMPLETE (UACMP) WITH MICROSCOPIC
Bilirubin Urine: NEGATIVE
Glucose, UA: NEGATIVE mg/dL
Hgb urine dipstick: NEGATIVE
Ketones, ur: NEGATIVE mg/dL
Leukocytes,Ua: NEGATIVE
Nitrite: NEGATIVE
Protein, ur: NEGATIVE mg/dL
Specific Gravity, Urine: 1.02 (ref 1.005–1.030)
pH: 7.5 (ref 5.0–8.0)

## 2022-02-27 MED ORDER — SULFAMETHOXAZOLE-TRIMETHOPRIM 800-160 MG PO TABS: 1.0000 | ORAL_TABLET | Freq: Two times a day (BID) | ORAL | 0 refills | Status: DC

## 2022-02-27 MED ORDER — NITROFURANTOIN MONOHYD MACRO 100 MG PO CAPS: 100.0000 mg | ORAL_CAPSULE | Freq: Two times a day (BID) | ORAL | 0 refills | Status: DC

## 2022-02-27 NOTE — Discharge Instructions (Addendum)
We will call you if the urine culture comes  back and shows we need to make changes to your medication.

## 2022-02-27 NOTE — Telephone Encounter (Signed)
S/w [facility] to cancel Bactroban rx that was sent by Dreama Saa PA-C on 02/27/22 & to fill Macrobid 100 mg for pt to start 1 cap po x2 daily.

## 2022-02-27 NOTE — ED Triage Notes (Signed)
Pt c/o urinary frequency, burning, hematuria ongoing x1 wk. Pt has tried AZO otc w/minor relief. Has hx of UTI. Pt denies any fevers or back pain.

## 2022-02-27 NOTE — ED Provider Notes (Signed)
[facility]    CSN: [id number] Arrival date & time: 02/27/22  1526      History   Chief Complaint Chief Complaint  Patient presents with   Urinary Frequency    Entered by patient    HPI Erica Scott is a 21 y.o. female who presents with hx of dysuria, hematuria and frequency x 1 week. Has been taking Azo. Has hx of frequent UTI's. Denies fever, flank pain.  Has had low suspicious UA's but the culture grew klebsiella 2 years ago. She states lately she has grown a different bacterial susceptible to Macrobid and this is what her PCP treats her with for 7-10 days. Her last UTI was 4 months ago.    Past Medical History:  Diagnosis Date   Chronic tonsillitis    Complication of anesthesia    Family history of adverse reaction to anesthesia     Patient Active Problem List   Diagnosis Date Noted   Allergic rhinitis 10/25/2020   History of sore throat 10/25/2020   Status post tonsillectomy 10/25/2020    Past Surgical History:  Procedure Laterality Date   TONSILLECTOMY AND ADENOIDECTOMY N/A 10/25/2017   Procedure: TONSILLECTOMY AND ADENOIDECTOMY;  Surgeon: Linus Salmons, MD;  Location: [facility];  Service: ENT;  Laterality: N/A;   WISDOM TOOTH EXTRACTION      OB History   No obstetric history on file.      Home Medications    Prior to Admission medications   Medication Sig Start Date End Date Taking? Authorizing Provider  fluticasone (FLONASE) 50 MCG/ACT nasal spray Place 1 spray into both nostrils daily.  01/26/19  Yes [provider]  montelukast (SINGULAIR) 10 MG tablet Take 10 mg by mouth at bedtime.   Yes [provider]  nitrofurantoin, macrocrystal-monohydrate, (MACROBID) 100 MG capsule Take 1 capsule (100 mg total) by mouth 2 (two) times daily. 02/27/22  Yes Rodriguez-Southworth, Nettie Elm, PA-C  Probiotic Product (PROBIOTIC PO) Take by mouth daily.   Yes [provider]  Meth-Hyo-M Bl-Na Phos-Ph Sal (URIBEL)  118 MG CAPS Take 1 capsule (118 mg total) by mouth 4 (four) times daily as needed (dysuria). 11/17/19   Vanna Scotland, MD  oxybutynin (DITROPAN) 5 MG tablet Take 1 tablet (5 mg total) by mouth every 8 (eight) hours as needed for bladder spasms (urinary frequency). 11/17/19   Vanna Scotland, MD    Family History Family History  Problem Relation Age of Onset   Prostate cancer Neg Hx    Bladder Cancer Neg Hx    Kidney cancer Neg Hx     Social History Social History   Tobacco Use   Smoking status: Never   Smokeless tobacco: Never  Vaping Use   Vaping Use: Never used  Substance Use Topics   Alcohol use: Never   Drug use: Never     Allergies   Patient has no known allergies.   Review of Systems Review of Systems  Constitutional:  Negative for fever.  Gastrointestinal:  Negative for constipation.  Genitourinary:  Positive for dysuria, frequency and hematuria. Negative for flank pain.  Musculoskeletal:  Positive for back pain.       On R lumbar area     Physical Exam Triage Vital Signs ED Triage Vitals  Enc Vitals Group     BP 02/27/22 1601 138/84     Pulse Rate 02/27/22 1601 (!) 118     Resp 02/27/22 1601 16     Temp 02/27/22 1601 98.4 F (36.9 C)  Temp Source 02/27/22 1601 Oral     SpO2 02/27/22 1601 100 %     Weight 02/27/22 1600 200 lb (90.7 kg)     Height 02/27/22 1600 5\' 7"  (1.702 m)     Head Circumference --      Peak Flow --      Pain Score 02/27/22 1600 0     Pain Loc --      Pain Edu? --      Excl. in [place]? --    No data found.  Updated Vital Signs BP 138/84 (BP Location: Left Arm)   Pulse (!) 118   Temp 98.4 F (36.9 C) (Oral)   Resp 16   Ht 5\' 7"  (1.702 m)   Wt 200 lb (90.7 kg)   LMP 02/10/2022 (Exact Date)   SpO2 100%   BMI 31.32 kg/m   Visual Acuity Right Eye Distance:   Left Eye Distance:   Bilateral Distance:    Right Eye Near:   Left Eye Near:    Bilateral Near:      Physical Exam Vitals and nursing note reviewed.   Constitutional:      General: She is not in acute distress.    Appearance: She is not toxic-appearing.  HENT:     Head: Normocephalic.     Right Ear: External ear normal.     Left Ear: External ear normal.  Eyes:     General: No scleral icterus.    Conjunctiva/sclera: Conjunctivae normal.  Pulmonary:     Effort: Pulmonary effort is normal.  Abdominal:     General: Bowel sounds are normal.     Palpations: Abdomen is soft. There is no mass.     Tenderness: There is no guarding or rebound.     Comments: - CVA tenderness   Musculoskeletal:        General: Normal range of motion.     Cervical back: Neck supple.     Comments: BACK- has muscular tenderness on area of complaint with palpation  Skin:    General: Skin is warm and dry.     Findings: No rash.  Neurological:     Mental Status: She is alert and oriented to person, place, and time.     Gait: Gait normal.  Psychiatric:        Mood and Affect: Mood normal.        Behavior: Behavior normal.        Thought Content: Thought content normal.        Judgment: Judgment normal.    UC Treatments / Results  Labs (all labs ordered are listed, but only abnormal results are displayed) Labs Reviewed  URINALYSIS, COMPLETE (UACMP) WITH MICROSCOPIC - Abnormal; Notable for the following components:      Result Value   Bacteria, UA FEW (*)    All other components within normal limits  URINE CULTURE    EKG   Radiology No results found.  Procedures Procedures (including critical care time)  Medications Ordered in UC Medications - No data to display  Initial Impression / Assessment and Plan / UC Course  I have reviewed the triage vital signs and the nursing notes.  Pertinent labs  results that were available during my care of the patient were reviewed by me and considered in my medical decision making (see chart for details).  UTI- recurrent  I d/c the bactrim I had sent, and sent Macrobid as noted.  The urine was sent  for a culture  and we will call pt if we need to make any changes to her medication.  Final Clinical Impressions(s) / UC Diagnoses   Final diagnoses:  Dysuria     Discharge Instructions      We will call you if the urine culture comes  back and shows we need to make changes to your medication.      ED Prescriptions     Medication Sig Dispense Auth. Provider   sulfamethoxazole-trimethoprim (BACTRIM DS) 800-160 MG tablet  (Status: Discontinued) Take 1 tablet by mouth 2 (two) times daily for 7 days. 14 tablet Rodriguez-Southworth, Ulah Olmo, PA-C   nitrofurantoin, macrocrystal-monohydrate, (MACROBID) 100 MG capsule Take 1 capsule (100 mg total) by mouth 2 (two) times daily. 20 capsule Rodriguez-Southworth, Nettie Elm, PA-C      PDMP not reviewed this encounter.   Garey Ham, [place] 02/27/22 1647

## 2022-03-01 DIAGNOSIS — N6325 Unspecified lump in the left breast, overlapping quadrants: Secondary | ICD-10-CM | POA: Diagnosis not present

## 2022-03-01 DIAGNOSIS — N641 Fat necrosis of breast: Secondary | ICD-10-CM | POA: Diagnosis not present

## 2022-03-01 DIAGNOSIS — N6322 Unspecified lump in the left breast, upper inner quadrant: Secondary | ICD-10-CM | POA: Diagnosis not present

## 2022-03-01 LAB — URINE CULTURE: Culture: 10000 — AB

## 2022-03-05 DIAGNOSIS — J301 Allergic rhinitis due to pollen: Secondary | ICD-10-CM | POA: Diagnosis not present

## 2022-03-21 DIAGNOSIS — J301 Allergic rhinitis due to pollen: Secondary | ICD-10-CM | POA: Diagnosis not present

## 2022-04-02 ENCOUNTER — Ambulatory Visit
Admission: RE | Admit: 2022-04-02 | Discharge: 2022-04-02 | Disposition: A | Discharge status: Home / Self Care | Payer: BC Managed Care – PPO | Source: Ambulatory Visit | Attending: Internal Medicine | Admitting: Internal Medicine

## 2022-04-02 ENCOUNTER — Other Ambulatory Visit: Payer: Self-pay

## 2022-04-02 VITALS — BP 140/86 | HR 99 | Temp 98.6°F | Resp 20

## 2022-04-02 DIAGNOSIS — R300 Dysuria: Secondary | ICD-10-CM

## 2022-04-02 LAB — POCT URINALYSIS DIP (MANUAL ENTRY)
Bilirubin, UA: NEGATIVE
Blood, UA: NEGATIVE
Glucose, UA: NEGATIVE mg/dL
Ketones, POC UA: NEGATIVE mg/dL
Leukocytes, UA: NEGATIVE
Nitrite, UA: NEGATIVE
Protein Ur, POC: NEGATIVE mg/dL
Spec Grav, UA: <=1.005 — AB (ref 1.010–1.025)
Urobilinogen, UA: 0.2 E.U./dL
pH, UA: 6 (ref 5.0–8.0)

## 2022-04-02 MED ORDER — NITROFURANTOIN MONOHYD MACRO 100 MG PO CAPS: 100.0000 mg | ORAL_CAPSULE | Freq: Two times a day (BID) | ORAL | 0 refills | Status: DC

## 2022-04-02 NOTE — ED Triage Notes (Signed)
Symptoms started  1 to 1 1/2 weeks ago.  Patient has burning with urination, pelvic pain, urinary frequency. History of uti.

## 2022-04-02 NOTE — Discharge Instructions (Signed)
Follow up here or with your primary care provider if your symptoms are worsening or not improving.     

## 2022-04-02 NOTE — ED Provider Notes (Signed)
Erica Scott    CSN: [id number] Arrival date & time: 04/02/22  1457      History   Chief Complaint Chief Complaint  Patient presents with   Urinary Frequency    Urinary tract infection symptoms - Entered by patient   Appointment    3:00    HPI Erica Scott is a 22 y.o. female.    Urinary Frequency    Presents with symptoms x 1 to 2 weeks.  Endorses burning with urination, pelvic pain, urinary frequency.  She endorses history of frequent UTI and most recently treated with Macrobid following a course of Bactrim which was ineffective.  This was 2 to 3 months ago.  Past Medical History:  Diagnosis Date   Chronic tonsillitis    Complication of anesthesia    Family history of adverse reaction to anesthesia     Patient Active Problem List   Diagnosis Date Noted   Allergic rhinitis 10/25/2020   History of sore throat 10/25/2020   Status post tonsillectomy 10/25/2020    Past Surgical History:  Procedure Laterality Date   TONSILLECTOMY AND ADENOIDECTOMY N/A 10/25/2017   Procedure: TONSILLECTOMY AND ADENOIDECTOMY;  Surgeon: Beverly Gust, MD;  Location: [facility];  Service: ENT;  Laterality: N/A;   WISDOM TOOTH EXTRACTION      OB History   No obstetric history on file.      Home Medications    Prior to Admission medications   Medication Sig Start Date End Date Taking? Authorizing Provider  fluticasone (FLONASE) 50 MCG/ACT nasal spray Place 1 spray into both nostrils daily.  Patient not taking: Reported on 04/02/2022 01/26/19   [provider]  Meth-Hyo-M Bl-Na Phos-Ph Sal (URIBEL) 118 MG CAPS Take 1 capsule (118 mg total) by mouth 4 (four) times daily as needed (dysuria). 11/17/19   Hollice Espy, MD  montelukast (SINGULAIR) 10 MG tablet Take 10 mg by mouth at bedtime. Patient not taking: Reported on 04/02/2022    [provider]  nitrofurantoin, macrocrystal-monohydrate, (MACROBID) 100 MG capsule Take 1 capsule (100 mg  total) by mouth 2 (two) times daily. Patient not taking: Reported on 04/02/2022 02/27/22   Rodriguez-Southworth, Sunday Spillers, PA-C  oxybutynin (DITROPAN) 5 MG tablet Take 1 tablet (5 mg total) by mouth every 8 (eight) hours as needed for bladder spasms (urinary frequency). 11/17/19   Hollice Espy, MD  Probiotic Product (PROBIOTIC PO) Take by mouth daily. Patient not taking: Reported on 04/02/2022    [provider]    Family History Family History  Problem Relation Age of Onset   Prostate cancer Neg Hx    Bladder Cancer Neg Hx    Kidney cancer Neg Hx     Social History Social History   Tobacco Use   Smoking status: Never   Smokeless tobacco: Never  Vaping Use   Vaping Use: Never used  Substance Use Topics   Alcohol use: Never   Drug use: Never     Allergies   Patient has no known allergies.   Review of Systems Review of Systems  Genitourinary:  Positive for frequency.     Physical Exam Triage Vital Signs ED Triage Vitals  Enc Vitals Group     BP 04/02/22 1519 (!) 140/86     Pulse Rate 04/02/22 1519 99     Resp 04/02/22 1519 20     Temp 04/02/22 1519 98.6 F (37 C)     Temp Source 04/02/22 1519 Oral     SpO2 04/02/22 1519 99 %  Weight --      Height --      Head Circumference --      Peak Flow --      Pain Score 04/02/22 1516 5     Pain Loc --      Pain Edu? --      Excl. in [place]? --    No data found.  Updated Vital Signs BP (!) 140/86 (BP Location: Right Arm)   Pulse 99   Temp 98.6 F (37 C) (Oral)   Resp 20   LMP 03/09/2022   SpO2 99%   Visual Acuity Right Eye Distance:   Left Eye Distance:   Bilateral Distance:    Right Eye Near:   Left Eye Near:    Bilateral Near:     Physical Exam Vitals reviewed.  Constitutional:      Appearance: Normal appearance.  Skin:    General: Skin is warm and dry.  Neurological:     General: No focal deficit present.     Mental Status: She is alert and oriented to person, place, and time.   Psychiatric:        Mood and Affect: Mood normal.        Behavior: Behavior normal.      UC Treatments / Results  Labs (all labs ordered are listed, but only abnormal results are displayed) Labs Reviewed  POCT URINALYSIS DIP (MANUAL ENTRY)    EKG   Radiology No results found.  Procedures Procedures (including critical care time)  Medications Ordered in UC Medications - No data to display  Initial Impression / Assessment and Plan / UC Course  I have reviewed the triage vital signs and the nursing notes.  Pertinent labs & imaging results that were available during my care of the patient were reviewed by me and considered in my medical decision making (see chart for details).   Uncomfortable appearing patient with UA not shedding any light on his of her symptoms.  Will treat presumptively with Macrobid and send for culture as her urine is very pale and colored and may be too dilute to register anything of value on the UA in clinic.   Final Clinical Impressions(s) / UC Diagnoses   Final diagnoses:  None   Discharge Instructions   None    ED Prescriptions   None    PDMP not reviewed this encounter.   Rose Phi, [place] 04/02/22 1534

## 2022-04-04 LAB — URINE CULTURE: Culture: <10000 — AB

## 2022-04-09 DIAGNOSIS — J301 Allergic rhinitis due to pollen: Secondary | ICD-10-CM | POA: Diagnosis not present

## 2022-04-16 DIAGNOSIS — J301 Allergic rhinitis due to pollen: Secondary | ICD-10-CM | POA: Diagnosis not present

## 2022-04-17 DIAGNOSIS — J309 Allergic rhinitis, unspecified: Secondary | ICD-10-CM | POA: Diagnosis not present

## 2022-04-23 DIAGNOSIS — J301 Allergic rhinitis due to pollen: Secondary | ICD-10-CM | POA: Diagnosis not present

## 2022-04-26 ENCOUNTER — Inpatient Hospital Stay: Admission: RE | Admit: 2022-04-26 | Payer: Self-pay | Source: Ambulatory Visit

## 2022-04-26 ENCOUNTER — Ambulatory Visit: Payer: Self-pay

## 2022-04-30 ENCOUNTER — Ambulatory Visit
Admission: RE | Admit: 2022-04-30 | Discharge: 2022-04-30 | Disposition: A | Discharge status: Home / Self Care | Payer: BC Managed Care – PPO | Source: Ambulatory Visit | Attending: Physician Assistant | Admitting: Physician Assistant

## 2022-04-30 VITALS — BP 127/81 | HR 93 | Temp 99.6°F | Resp 16 | Ht 67.0 in | Wt 200.0 lb

## 2022-04-30 DIAGNOSIS — R300 Dysuria: Secondary | ICD-10-CM

## 2022-04-30 DIAGNOSIS — R350 Frequency of micturition: Secondary | ICD-10-CM

## 2022-04-30 DIAGNOSIS — J301 Allergic rhinitis due to pollen: Secondary | ICD-10-CM | POA: Diagnosis not present

## 2022-04-30 LAB — URINALYSIS, W/ REFLEX TO CULTURE (INFECTION SUSPECTED)
Bilirubin Urine: NEGATIVE
Glucose, UA: NEGATIVE mg/dL
Hgb urine dipstick: NEGATIVE
Ketones, ur: NEGATIVE mg/dL
Leukocytes,Ua: NEGATIVE
Nitrite: NEGATIVE
Protein, ur: NEGATIVE mg/dL
RBC / HPF: NONE SEEN RBC/hpf (ref 0–5)
Specific Gravity, Urine: 1.015 (ref 1.005–1.030)
pH: 7 (ref 5.0–8.0)

## 2022-04-30 MED ORDER — NITROFURANTOIN MONOHYD MACRO 100 MG PO CAPS: 100.0000 mg | ORAL_CAPSULE | Freq: Two times a day (BID) | ORAL | 0 refills | Status: DC

## 2022-04-30 NOTE — ED Triage Notes (Signed)
Pt c/o dysuria, urinary frequency, and lower back pain. Started about a week ago. Denies fever.

## 2022-04-30 NOTE — ED Provider Notes (Signed)
[facility]    CSN: [id number] Arrival date & time: 04/30/22  1507      History   Chief Complaint Chief Complaint  Patient presents with   Urinary Frequency   Dysuria   Appointment    HPI Erica Scott is a 22 y.o. female.   Patient presents for evaluation of dysuria, urinary urgency, frequency and bilateral flank pain beginning 7 days ago.  Has not attempted treatment of symptoms.  Denies sexual activity.  Denies lower abdominal pain or pressure, fevers, chills or vaginal symptoms.  History of reoccurring infections with most recent December 2023.     Past Medical History:  Diagnosis Date   Chronic tonsillitis    Complication of anesthesia    Family history of adverse reaction to anesthesia     Patient Active Problem List   Diagnosis Date Noted   Allergic rhinitis 10/25/2020   History of sore throat 10/25/2020   Status post tonsillectomy 10/25/2020    Past Surgical History:  Procedure Laterality Date   TONSILLECTOMY AND ADENOIDECTOMY N/A 10/25/2017   Procedure: TONSILLECTOMY AND ADENOIDECTOMY;  Surgeon: Beverly Gust, MD;  Location: [facility];  Service: ENT;  Laterality: N/A;   WISDOM TOOTH EXTRACTION      OB History   No obstetric history on file.      Home Medications    Prior to Admission medications   Medication Sig Start Date End Date Taking? Authorizing Provider  fexofenadine (ALLEGRA) 180 MG tablet Take 180 mg by mouth daily.   Yes [provider]  montelukast (SINGULAIR) 10 MG tablet Take 10 mg by mouth at bedtime.   Yes [provider]  norethindrone (AYGESTIN) 5 MG tablet  01/16/22  Yes [provider]  fluticasone (FLONASE) 50 MCG/ACT nasal spray Place 1 spray into both nostrils daily.  Patient not taking: Reported on 04/02/2022 01/26/19   [provider]  Meth-Hyo-M Bl-Na Phos-Ph Sal (URIBEL) 118 MG CAPS Take 1 capsule (118 mg total) by mouth 4 (four) times daily as needed (dysuria).  11/17/19   Hollice Espy, MD  nitrofurantoin, macrocrystal-monohydrate, (MACROBID) 100 MG capsule Take 1 capsule (100 mg total) by mouth 2 (two) times daily. 04/02/22   Immordino, Annie Main, FNP  oxybutynin (DITROPAN) 5 MG tablet Take 1 tablet (5 mg total) by mouth every 8 (eight) hours as needed for bladder spasms (urinary frequency). 11/17/19   Hollice Espy, MD  Probiotic Product (PROBIOTIC PO) Take by mouth daily. Patient not taking: Reported on 04/02/2022    [provider]    Family History Family History  Problem Relation Age of Onset   Prostate cancer Neg Hx    Bladder Cancer Neg Hx    Kidney cancer Neg Hx     Social History Social History   Tobacco Use   Smoking status: Never   Smokeless tobacco: Never  Vaping Use   Vaping Use: Never used  Substance Use Topics   Alcohol use: Never   Drug use: Never     Allergies   Patient has no known allergies.   Review of Systems Review of Systems  Respiratory: Negative.    Cardiovascular: Negative.   Gastrointestinal: Negative.   Genitourinary:  Positive for dysuria, flank pain, frequency and urgency. Negative for decreased urine volume, difficulty urinating, dyspareunia, enuresis, genital sores, hematuria, menstrual problem, pelvic pain, vaginal bleeding, vaginal discharge and vaginal pain.     Physical Exam Triage Vital Signs ED Triage Vitals  Enc Vitals Group     BP 04/30/22  1529 127/81     Pulse Rate 04/30/22 1529 93     Resp 04/30/22 1529 16     Temp 04/30/22 1529 99.6 F (37.6 C)     Temp Source 04/30/22 1529 Oral     SpO2 04/30/22 1529 98 %     Weight 04/30/22 1527 199 lb 15.3 oz (90.7 kg)     Height 04/30/22 1527 5' 7"$  (1.702 m)     Head Circumference --      Peak Flow --      Pain Score 04/30/22 1526 3     Pain Loc --      Pain Edu? --      Excl. in [place]? --    No data found.  Updated Vital Signs BP 127/81 (BP Location: Right Arm)   Pulse 93   Temp 99.6 F (37.6 C) (Oral)   Resp 16   Ht  5' 7"$  (1.702 m)   Wt 199 lb 15.3 oz (90.7 kg)   LMP 04/02/2022 (Approximate)   SpO2 98%   BMI 31.32 kg/m   Visual Acuity Right Eye Distance:   Left Eye Distance:   Bilateral Distance:    Right Eye Near:   Left Eye Near:    Bilateral Near:     Physical Exam Constitutional:      Appearance: Normal appearance.  Eyes:     Extraocular Movements: Extraocular movements intact.  Pulmonary:     Effort: Pulmonary effort is normal.  Abdominal:     General: Abdomen is flat. Bowel sounds are normal. There is no distension.     Palpations: Abdomen is soft.     Tenderness: There is no abdominal tenderness. There is right CVA tenderness. There is no left CVA tenderness or guarding.  Neurological:     Mental Status: She is alert and oriented to person, place, and time. Mental status is at baseline.      UC Treatments / Results  Labs (all labs ordered are listed, but only abnormal results are displayed) Labs Reviewed  URINALYSIS, W/ REFLEX TO CULTURE (INFECTION SUSPECTED) - Abnormal; Notable for the following components:      Result Value   Bacteria, UA RARE (*)    All other components within normal limits    EKG   Radiology No results found.  Procedures Procedures (including critical care time)  Medications Ordered in UC Medications - No data to display  Initial Impression / Assessment and Plan / UC Course  I have reviewed the triage vital signs and the nursing notes.  Pertinent labs & imaging results that were available during my care of the patient were reviewed by me and considered in my medical decision making (see chart for details).  Clinical Course as of 04/30/22 1552  Mon Apr 30, 2022  1551 Hgb urine dipstick: NEGATIVE [AW]    Clinical Course User Index [AW] Hans Eden, NP    Urinary frequency, dysuria  Vital signs are stable patient is in no signs of distress right side otherwise stable exam, urinalysis negative, sent for culture, heart review  patient has had negative dipsticks in the past with bacterial growth seen on culture therefore treating prophylactically for E. coli based on December test results, Macrobid prescribed, recommended over-the-counter analgesics with Pyridium for discomfort, advised increase fluid intake and good hygiene measures, may follow-up with this urgent care as needed Final Clinical Impressions(s) / UC Diagnoses   Final diagnoses:  None   Discharge Instructions   None  ED Prescriptions   None    PDMP not reviewed this encounter.   Hans Eden, NP 04/30/22 1605

## 2022-04-30 NOTE — Discharge Instructions (Signed)
Your urinalysis was negative for infection, did not show leukocytes or nitrates therefore urine has been sent to culture to determine if bacterial growth  If no bacterial growth seen you may stop antibiotic at that time I would recommend that you be checked for yeast or bacterial vaginosis to rule out as a cause  Begin use of Macrobid every morning and every evening for 5 days, typically takes about 48 hours for you begin to see improvement  You may use over-the-counter Azo to help minimize your symptoms until antibiotic removes bacteria, this medication will turn your urine orange  Increase your fluid intake through use of water  As always practice good hygiene, wiping front to back and avoidance of scented vaginal products to prevent further irritation  If symptoms continue to persist after use of medication or recur please follow-up with urgent care or your primary doctor as needed

## 2022-05-01 LAB — URINE CULTURE: Culture: <10000 — AB

## 2022-05-14 DIAGNOSIS — J301 Allergic rhinitis due to pollen: Secondary | ICD-10-CM | POA: Diagnosis not present

## 2022-05-25 DIAGNOSIS — N6320 Unspecified lump in the left breast, unspecified quadrant: Secondary | ICD-10-CM | POA: Diagnosis not present

## 2022-05-25 DIAGNOSIS — N6325 Unspecified lump in the left breast, overlapping quadrants: Secondary | ICD-10-CM | POA: Diagnosis not present

## 2022-05-25 DIAGNOSIS — N6310 Unspecified lump in the right breast, unspecified quadrant: Secondary | ICD-10-CM | POA: Diagnosis not present

## 2022-05-28 DIAGNOSIS — J301 Allergic rhinitis due to pollen: Secondary | ICD-10-CM | POA: Diagnosis not present

## 2022-06-04 DIAGNOSIS — J301 Allergic rhinitis due to pollen: Secondary | ICD-10-CM | POA: Diagnosis not present

## 2022-06-11 DIAGNOSIS — J301 Allergic rhinitis due to pollen: Secondary | ICD-10-CM | POA: Diagnosis not present

## 2022-06-12 DIAGNOSIS — J301 Allergic rhinitis due to pollen: Secondary | ICD-10-CM | POA: Diagnosis not present

## 2022-06-18 DIAGNOSIS — J301 Allergic rhinitis due to pollen: Secondary | ICD-10-CM | POA: Diagnosis not present

## 2022-06-21 DIAGNOSIS — N809 Endometriosis, unspecified: Secondary | ICD-10-CM | POA: Diagnosis not present

## 2022-06-21 DIAGNOSIS — R102 Pelvic and perineal pain: Secondary | ICD-10-CM | POA: Diagnosis not present

## 2022-06-25 ENCOUNTER — Ambulatory Visit
Admission: RE | Admit: 2022-06-25 | Discharge: 2022-06-25 | Disposition: A | Discharge status: Home / Self Care | Payer: BC Managed Care – PPO | Source: Ambulatory Visit

## 2022-06-25 VITALS — BP 134/79 | HR 100 | Temp 98.7°F | Resp 16

## 2022-06-25 DIAGNOSIS — H6991 Unspecified Eustachian tube disorder, right ear: Secondary | ICD-10-CM | POA: Diagnosis not present

## 2022-06-25 DIAGNOSIS — H9201 Otalgia, right ear: Secondary | ICD-10-CM

## 2022-06-25 DIAGNOSIS — J309 Allergic rhinitis, unspecified: Secondary | ICD-10-CM

## 2022-06-25 NOTE — ED Provider Notes (Signed)
[facility]    CSN: [id number] Arrival date & time: 06/25/22  1458      History   Chief Complaint Chief Complaint  Patient presents with   Ear Pain   Facial Pain    HPI Erica Scott is a 22 y.o. female presenting with her mother for right-sided ear pain and swollen lymph nodes of the right side of her neck as well as sinus pressure and nasal congestion over the past few days.  Denies fever, fatigue, ear drainage, sore throat, chest pain, shortness of breath or wheezing, vomiting or diarrhea.  No sick contacts or reports of any known exposure to COVID or flu.  Has been taking Allegra and using Flonase.  She does have a history of chronic allergies.  She is also prescribed Singulair.No other concerns.  HPI  Past Medical History:  Diagnosis Date   Chronic tonsillitis    Complication of anesthesia    Family history of adverse reaction to anesthesia     Patient Active Problem List   Diagnosis Date Noted   Allergic rhinitis 10/25/2020   History of sore throat 10/25/2020   Status post tonsillectomy 10/25/2020    Past Surgical History:  Procedure Laterality Date   TONSILLECTOMY AND ADENOIDECTOMY N/A 10/25/2017   Procedure: TONSILLECTOMY AND ADENOIDECTOMY;  Surgeon: Linus Salmons, MD;  Location: [facility];  Service: ENT;  Laterality: N/A;   WISDOM TOOTH EXTRACTION      OB History   No obstetric history on file.      Home Medications    Prior to Admission medications   Medication Sig Start Date End Date Taking? Authorizing Provider  fexofenadine (ALLEGRA) 180 MG tablet Take 180 mg by mouth daily.    [provider]  montelukast (SINGULAIR) 10 MG tablet Take 10 mg by mouth at bedtime.    [provider]  norethindrone (AYGESTIN) 5 MG tablet  01/16/22   [provider]    Family History Family History  Problem Relation Age of Onset   Prostate cancer Neg Hx    Bladder Cancer Neg Hx    Kidney cancer Neg Hx      Social History Social History   Tobacco Use   Smoking status: Never   Smokeless tobacco: Never  Vaping Use   Vaping Use: Never used  Substance Use Topics   Alcohol use: Never   Drug use: Never     Allergies   Keflex [cephalexin]   Review of Systems Review of Systems  Constitutional:  Negative for fatigue and fever.  HENT:  Positive for ear pain, rhinorrhea and sinus pressure. Negative for congestion and sore throat.   Respiratory:  Negative for cough, shortness of breath and wheezing.   Gastrointestinal:  Negative for diarrhea, nausea and vomiting.  Neurological:  Negative for dizziness and headaches.  Hematological:  Positive for adenopathy.     Physical Exam Triage Vital Signs ED Triage Vitals  Enc Vitals Group     BP      Pulse      Resp      Temp      Temp src      SpO2      Weight      Height      Head Circumference      Peak Flow      Pain Score      Pain Loc      Pain Edu?      Excl. in GC?    No  data found.  Updated Vital Signs BP 134/79 (BP Location: Left Arm)   Pulse 100   Temp 98.7 F (37.1 C) (Oral)   Resp 16   LMP 06/03/2022 (Exact Date)   SpO2 100%    Physical Exam Vitals and nursing note reviewed.  Constitutional:      General: She is not in acute distress.    Appearance: Normal appearance. She is not ill-appearing or toxic-appearing.  HENT:     Head: Normocephalic and atraumatic.     Right Ear: Tympanic membrane, ear canal and external ear normal.     Left Ear: Ear canal and external ear normal. A middle ear effusion is present.     Nose: Congestion present.     Mouth/Throat:     Mouth: Mucous membranes are moist.     Pharynx: Oropharynx is clear.  Eyes:     General: No scleral icterus.       Right eye: No discharge.        Left eye: No discharge.     Conjunctiva/sclera: Conjunctivae normal.  Cardiovascular:     Rate and Rhythm: Normal rate and regular rhythm.     Heart sounds: Normal heart sounds.  Pulmonary:      Effort: Pulmonary effort is normal. No respiratory distress.     Breath sounds: Normal breath sounds.  Musculoskeletal:     Cervical back: Neck supple.  Lymphadenopathy:     Cervical: No cervical adenopathy.  Skin:    General: Skin is dry.  Neurological:     General: No focal deficit present.     Mental Status: She is alert. Mental status is at baseline.     Motor: No weakness.     Gait: Gait normal.  Psychiatric:        Mood and Affect: Mood normal.        Behavior: Behavior normal.        Thought Content: Thought content normal.      UC Treatments / Results  Labs (all labs ordered are listed, but only abnormal results are displayed) Labs Reviewed - No data to display  EKG   Radiology No results found.  Procedures Procedures (including critical care time)  Medications Ordered in UC Medications - No data to display  Initial Impression / Assessment and Plan / UC Course  I have reviewed the triage vital signs and the nursing notes.  Pertinent labs & imaging results that were available during my care of the patient were reviewed by me and considered in my medical decision making (see chart for details).   22 year old female with history of allergies presents for right ear pain, swollen lymph nodes, nasal congestion, and sinus pressure for the past few days.  No fever, breathing difficulty.  Has tried Sports coachAllegra and Flonase without relief.   Vitals normal and stable.  Patient is overall well-appearing.  On exam she has nasal congestion, clear effusion of right TM without erythema or bulging.  Throat is clear.  Chest clear to auscultation and heart regular rate and rhythm.   Presentation consistent with allergic rhinitis and eustachian tube dysfunction.  Advised switching to Allegra-D.  Offered to call this into the pharmacy but patient declines.  Advised to continue with Flonase and Singulair.  We discussed switching to the Allegra-D.  She will consider purchasing that behind  the counter.  Offered corticosteroids to help with flareup of allergies but patient declines.  Encouraged increasing rest and fluids.  If not feeling better in a couple  of weeks or symptoms acutely worsen that she should be seen again and reevaluated.   Final Clinical Impressions(s) / UC Diagnoses   Final diagnoses:  Right ear pain  Dysfunction of right eustachian tube  Allergic rhinitis, unspecified seasonality, unspecified trigger     Discharge Instructions      -You do not have ear infection.  You do have fluid behind the eardrum but not due to infection.  Symptoms likely related to allergies. - Switch to Allegra-D and continue using Flonase.  Nasal saline and sinus rinses also advised.  You can take ibuprofen as well. - If your symptoms or not better in a week consider reexamination to see if you need an antibiotic but not indicated at this time.     ED Prescriptions   None    I have reviewed the PDMP during this encounter.   Shirlee Latch, PA-C 06/25/22 1635

## 2022-06-25 NOTE — ED Triage Notes (Signed)
Patient with c/o sinus pressure and right ear pain since Saturday. States she took tylenol for pain with no relief.

## 2022-06-25 NOTE — Discharge Instructions (Signed)
-  You do not have ear infection.  You do have fluid behind the eardrum but not due to infection.  Symptoms likely related to allergies. - Switch to Allegra-D and continue using Flonase.  Nasal saline and sinus rinses also advised.  You can take ibuprofen as well. - If your symptoms or not better in a week consider reexamination to see if you need an antibiotic but not indicated at this time.

## 2022-06-26 DIAGNOSIS — J019 Acute sinusitis, unspecified: Secondary | ICD-10-CM | POA: Diagnosis not present

## 2022-07-02 DIAGNOSIS — J301 Allergic rhinitis due to pollen: Secondary | ICD-10-CM | POA: Diagnosis not present

## 2022-07-09 DIAGNOSIS — J301 Allergic rhinitis due to pollen: Secondary | ICD-10-CM | POA: Diagnosis not present

## 2022-07-21 DIAGNOSIS — H9201 Otalgia, right ear: Secondary | ICD-10-CM | POA: Diagnosis not present

## 2022-07-21 DIAGNOSIS — Z6829 Body mass index (BMI) 29.0-29.9, adult: Secondary | ICD-10-CM | POA: Diagnosis not present

## 2022-07-23 DIAGNOSIS — J301 Allergic rhinitis due to pollen: Secondary | ICD-10-CM | POA: Diagnosis not present

## 2022-08-09 ENCOUNTER — Ambulatory Visit (INDEPENDENT_AMBULATORY_CARE_PROVIDER_SITE_OTHER): Payer: BC Managed Care – PPO | Admitting: Family Medicine

## 2022-08-09 ENCOUNTER — Encounter: Payer: Self-pay | Admitting: Family Medicine

## 2022-08-09 VITALS — BP 126/85 | HR 90 | Temp 98.1°F | Resp 12 | Ht 67.0 in | Wt 194.5 lb

## 2022-08-09 DIAGNOSIS — J309 Allergic rhinitis, unspecified: Secondary | ICD-10-CM

## 2022-08-09 DIAGNOSIS — Z8709 Personal history of other diseases of the respiratory system: Secondary | ICD-10-CM | POA: Diagnosis not present

## 2022-08-09 DIAGNOSIS — Z9089 Acquired absence of other organs: Secondary | ICD-10-CM | POA: Diagnosis not present

## 2022-08-09 DIAGNOSIS — F411 Generalized anxiety disorder: Secondary | ICD-10-CM | POA: Insufficient documentation

## 2022-08-09 DIAGNOSIS — H66004 Acute suppurative otitis media without spontaneous rupture of ear drum, recurrent, right ear: Secondary | ICD-10-CM

## 2022-08-09 DIAGNOSIS — J301 Allergic rhinitis due to pollen: Secondary | ICD-10-CM | POA: Diagnosis not present

## 2022-08-09 MED ORDER — SERTRALINE HCL 50 MG PO TABS: 50.0000 mg | ORAL_TABLET | Freq: Every day | ORAL | 3 refills | Status: DC

## 2022-08-09 MED ORDER — DOXYCYCLINE HYCLATE 100 MG PO TABS: 100.0000 mg | ORAL_TABLET | Freq: Two times a day (BID) | ORAL | 0 refills | Status: AC

## 2022-08-09 MED ORDER — MONTELUKAST SODIUM 10 MG PO TABS: 10.0000 mg | ORAL_TABLET | Freq: Every day | ORAL | 1 refills | Status: DC

## 2022-08-09 NOTE — Assessment & Plan Note (Signed)
Chronic and well controlled Continue antihistamine, flonase and singulair

## 2022-08-09 NOTE — Progress Notes (Signed)
I,Sulibeya S Dimas,acting as a Neurosurgeon for Shirlee Latch, MD.,have documented all relevant documentation on the behalf of Shirlee Latch, MD,as directed by  Shirlee Latch, MD while in the presence of Shirlee Latch, MD.   New patient visit   Patient: Erica Scott   DOB: 2000/12/10   21 y.o. Female  MRN: [id number] Visit Date: 08/09/2022  Today's healthcare provider: Shirlee Latch, MD   Chief Complaint  Patient presents with   New Patient (Initial Visit)   URI   Anxiety   Subjective    Erica Scott is a 22 y.o. female who presents today as a new patient to establish care.   Anxiety Presents for initial visit. Episode onset: on and off 4 years. The problem has been waxing and waning. Symptoms include chest pain, depressed mood, excessive worry, insomnia, nervous/anxious behavior and restlessness. Patient reports no compulsions, confusion, decreased concentration, dizziness, dry mouth, feeling of choking, hyperventilation, irritability, malaise, muscle tension, nausea, obsessions, palpitations, panic or shortness of breath. Symptoms occur constantly. The severity of symptoms is mild. The symptoms are aggravated by work stress. The patient sleeps 7 hours per night. The quality of sleep is good. Nighttime awakenings: one to two.   Past treatments include nothing.   started in 2020 with covid - developed health anxiety - this is some better Now with generalized anxiety Ruminating thoughts at bedtime     08/09/2022    4:11 PM  GAD 7 : Generalized Anxiety Score  Nervous, Anxious, on Edge 3  Control/stop worrying 1  Worry too much - different things 3  Trouble relaxing 1  Restless 0  Easily annoyed or irritable 0  Afraid - awful might happen 2  Total GAD 7 Score 10  Anxiety Difficulty Not difficult at all        08/09/2022    4:10 PM  Depression screen PHQ 2/9  Decreased Interest 0  Down, Depressed, Hopeless 0  PHQ - 2 Score 0  Altered sleeping 3   Tired, decreased energy 1  Change in appetite 0  Feeling bad or failure about yourself  0  Trouble concentrating 0  Moving slowly or fidgety/restless 0  Suicidal thoughts 0  PHQ-9 Score 4  Difficult doing work/chores Not difficult at all      Patient transferring [facility].  Upper respiratory symptoms She complains of right ear pressure/pain, low grade fever, nasal congestion, sinus pressure, and sore throat.with no fever, chills, night sweats or weight loss. Onset of symptoms was about a month ago and worsening.She is drinking plenty of fluids.  Past history is significant for no history of pneumonia or bronchitis. Patient is non-smoker  Has h/o R ear infections UC told her that they couldn't tell if there was an infection  Throat problem - chroinic tonsillitis - had them removed in 2019 due to GBS and pseudomonas infections in her throat. Kept getting pseudomonas after tonsillectomy. Saw ID and felt dismissed. Sore throat intermittently - mostly with anxiety.  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  Allergies well controlled ---------------------------------------------------------------------------------------------------   Past Medical History:  Diagnosis Date   Allergy    Anxiety    Chronic tonsillitis    Past Surgical History:  Procedure Laterality Date   TONSILLECTOMY AND ADENOIDECTOMY N/A 10/25/2017   Procedure: TONSILLECTOMY AND ADENOIDECTOMY;  Surgeon: Linus Salmons, MD;  Location: [facility];  Service: ENT;  Laterality: N/A;   WISDOM  TOOTH EXTRACTION     Family Status  Relation Name Status   Mother  Alive   Father  (Not Specified)   MGF  (Not Specified)   Neg Hx  (Not Specified)   Family History  Problem Relation Age of Onset   Hypertension Father    Hyperlipidemia Father    Heart disease Maternal Grandfather    Prostate cancer Neg Hx    Bladder Cancer Neg Hx     Kidney cancer Neg Hx    Social History   Socioeconomic History   Marital status: Single    Spouse name: Not on file   Number of children: Not on file   Years of education: Not on file   Highest education level: Not on file  Occupational History   Not on file  Tobacco Use   Smoking status: Never   Smokeless tobacco: Never  Vaping Use   Vaping Use: Never used  Substance and Sexual Activity   Alcohol use: Never   Drug use: Never   Sexual activity: Never    Birth control/protection: Abstinence  Other Topics Concern   Not on file  Social History Narrative   Not on file   Social Determinants of Health   Financial Resource Strain: Not on file  Food Insecurity: Not on file  Transportation Needs: Not on file  Physical Activity: Not on file  Stress: Not on file  Social Connections: Not on file   Outpatient Medications Prior to Visit  Medication Sig   fexofenadine (ALLEGRA) 180 MG tablet Take 180 mg by mouth daily.   fluticasone (FLONASE) 50 MCG/ACT nasal spray Place 1 spray into both nostrils 2 (two) times daily.   norethindrone (AYGESTIN) 5 MG tablet    [DISCONTINUED] montelukast (SINGULAIR) 10 MG tablet Take 10 mg by mouth at bedtime.   No facility-administered medications prior to visit.   Allergies  Allergen Reactions   Keflex [Cephalexin] Rash    Immunization History  Administered Date(s) Administered   DTaP 03/07/2001, 05/07/2001, 07/15/2001, 04/06/2002, 01/03/2005   HIB (PRP-OMP) 03/07/2001, 05/07/2001, 07/15/2001, 04/06/2002   HPV 9-valent 01/21/2012, 03/26/2012, 07/28/2012   Hep B, Unspecified 11-13-2000, 02/03/2001, 07/15/2001   Hepatitis A, Ped/Adol-2 Dose 12/15/2013, 06/16/2014   Hepb-cpg 01/11/2022, 02/13/2022   IPV 03/07/2001, 05/07/2001, 01/05/2002, 01/03/2005   Influenza Inj Mdck Quad Pf 11/27/2018   Influenza Nasal 03/06/2007, 03/08/2008, 01/27/2009, 01/07/2010, 11/22/2010   Influenza,inj,Quad PF,6+ Mos 12/07/2016, 12/07/2017    Influenza-Unspecified 03/26/2019, 12/07/2021   MMR 01/05/2002, 01/03/2005   Meningococcal B, OMV 01/08/2017, 02/15/2017   Meningococcal Mcv4o 03/26/2012, 04/12/2017   PFIZER Comirnaty(Gray Top)Covid-19 Tri-Sucrose Vaccine 05/31/2019, 06/22/2019, 01/11/2020   PFIZER(Purple Top)SARS-COV-2 Vaccination 05/31/2019, 06/22/2019, 01/11/2020   PPD Test 04/14/2021, 04/24/2021   Pfizer Covid-19 Vaccine Bivalent Booster 81yrs & up 11/28/2020, 12/13/2021   Pneumococcal Conjugate PCV 7 03/07/2001, 05/07/2001, 07/15/2001, 04/06/2002   Tdap 01/21/2012, 03/08/2021   Varicella 01/05/2002, 01/07/2006    Health Maintenance  Topic Date Due   HIV Screening  Never done   Hepatitis C Screening  Never done   PAP-Cervical Cytology Screening  Never done   PAP SMEAR-Modifier  Never done   COVID-19 Vaccine (9 - 2023-24 season) 02/07/2022   INFLUENZA VACCINE  10/18/2022   DTaP/Tdap/Td (8 - Td or Tdap) 03/09/2031   HPV VACCINES  Completed    Patient Care Team: Erasmo Downer, MD as PCP - General (Family Medicine)  Review of Systems  Constitutional:  Negative for irritability.  HENT:  Positive for congestion, ear pain, postnasal drip, sinus  pressure and sore throat.   Respiratory:  Negative for shortness of breath.   Cardiovascular:  Positive for chest pain. Negative for palpitations.  Gastrointestinal:  Negative for nausea.  Allergic/Immunologic: Positive for environmental allergies.  Neurological:  Negative for dizziness.  Psychiatric/Behavioral:  Negative for confusion and decreased concentration. The patient is nervous/anxious and has insomnia.        Objective    BP 126/85 (BP Location: Left Arm, Patient Position: Sitting, Cuff Size: Large)   Pulse 90   Temp 98.1 F (36.7 C) (Temporal)   Resp 12   Ht 5\' 7"  (1.702 m)   Wt 194 lb 8 oz (88.2 kg)   LMP 08/02/2022 (Exact Date)   SpO2 97%   BMI 30.46 kg/m    Physical Exam Vitals reviewed.  Constitutional:      General: She is not in  acute distress.    Appearance: Normal appearance. She is well-developed. She is not diaphoretic.  HENT:     Head: Normocephalic and atraumatic.     Right Ear: Ear canal and external ear normal. Tympanic membrane is bulging.     Left Ear: Tympanic membrane, ear canal and external ear normal.     Nose: Nose normal.     Mouth/Throat:     Mouth: Mucous membranes are moist.     Pharynx: Oropharynx is clear. No oropharyngeal exudate.  Eyes:     General: No scleral icterus.    Conjunctiva/sclera: Conjunctivae normal.     Pupils: Pupils are equal, round, and reactive to light.  Neck:     Thyroid: No thyromegaly.  Cardiovascular:     Rate and Rhythm: Normal rate and regular rhythm.     Pulses: Normal pulses.     Heart sounds: Normal heart sounds. No murmur heard. Pulmonary:     Effort: Pulmonary effort is normal. No respiratory distress.     Breath sounds: Normal breath sounds. No wheezing or rales.  Abdominal:     General: There is no distension.     Palpations: Abdomen is soft.     Tenderness: There is no abdominal tenderness.  Musculoskeletal:        General: No deformity.     Cervical back: Neck supple.     Right lower leg: No edema.     Left lower leg: No edema.  Lymphadenopathy:     Cervical: No cervical adenopathy.  Skin:    General: Skin is warm and dry.     Findings: No rash.  Neurological:     Mental Status: She is alert and oriented to person, place, and time. Mental status is at baseline.     Gait: Gait normal.  Psychiatric:        Mood and Affect: Mood normal.        Behavior: Behavior normal.        Thought Content: Thought content normal.      Depression Screen    08/09/2022    4:10 PM  PHQ 2/9 Scores  PHQ - 2 Score 0  PHQ- 9 Score 4   No results found for any visits on 08/09/22.  Assessment & Plan      Problem List Items Addressed This Visit       Respiratory   Allergic rhinitis    Chronic and well controlled Continue antihistamine, flonase and  singulair        Other   History of sore throat    H/o recurrent pharyngitis with GBS and pseudomonas S/p tonsillectomy Was  eval'd by ID and told it was colonization and not needing treatment Patient wants culture for peace of mind today No treatment necessary currently      Relevant Orders   Culture, Group A Strep   Status post tonsillectomy   GAD (generalized anxiety disorder)    Longstanding but worsening New diagnosis Will Start Zoloft 50 mg daily Discussed potential side effects, incl GI upset, sexual dysfunction, and SI Discussed that it can take 6-8 weeks to reach full efficacy Contracted for safety - no SI/HI Discussed synergistic effects of medications and therapy  F/u in 6 wks and repeat PHQ and GAD7      Relevant Medications   sertraline (ZOLOFT) 50 MG tablet   Other Visit Diagnoses     Recurrent acute suppurative otitis media of right ear without spontaneous rupture of tympanic membrane    -  Primary   Relevant Medications   doxycycline (VIBRA-TABS) 100 MG tablet      - symptoms and exam c/w R AOM without rupture - no evidence of sinusitis, CAP, strep pharyngitis, or other infection - will treat with Doxycycline x7d - discussed symptomatic management (flonase, tylenol, etc), natural course, and return precautions     Return in about 6 weeks (around 09/20/2022) for MDD/GAD f/u, virtual ok.     Total time spent on today's visit was greater than 45 minutes, including both face-to-face time and nonface-to-face time personally spent on review of chart (labs and imaging), discussing labs and goals, discussing further work-up, treatment options, reviewing outside records (vaccines updated), answering patient's questions, and coordinating care.  I, Shirlee Latch, MD, have reviewed all documentation for this visit. The documentation on 08/09/22 for the exam, diagnosis, procedures, and orders are all accurate and complete.   Eunice Oldaker, Marzella Schlein, MD,  [facility]

## 2022-08-09 NOTE — Assessment & Plan Note (Signed)
H/o recurrent pharyngitis with GBS and pseudomonas S/p tonsillectomy Was eval'd by ID and told it was colonization and not needing treatment Patient wants culture for peace of mind today No treatment necessary currently

## 2022-08-09 NOTE — Assessment & Plan Note (Signed)
Longstanding but worsening New diagnosis Will Start Zoloft 50 mg daily Discussed potential side effects, incl GI upset, sexual dysfunction, and SI Discussed that it can take 6-8 weeks to reach full efficacy Contracted for safety - no SI/HI Discussed synergistic effects of medications and therapy  F/u in 6 wks and repeat PHQ and GAD7

## 2022-08-10 ENCOUNTER — Encounter: Payer: Self-pay | Admitting: Family Medicine

## 2022-08-10 MED ORDER — SERTRALINE HCL 50 MG PO TABS: 50.0000 mg | ORAL_TABLET | Freq: Every day | ORAL | 3 refills | Status: DC

## 2022-08-10 NOTE — Telephone Encounter (Signed)
This is the starting low dose. Can you see if it needs PA? We don't usually for Zoloft. Also ok to resend it if pharmacy canceled it

## 2022-08-10 NOTE — Telephone Encounter (Signed)
Re-sent rx for zoloft to [facility].

## 2022-08-13 ENCOUNTER — Encounter: Payer: Self-pay | Admitting: Family Medicine

## 2022-08-13 LAB — CULTURE, GROUP A STREP: Strep A Culture: NEGATIVE

## 2022-08-14 ENCOUNTER — Other Ambulatory Visit: Payer: Self-pay

## 2022-08-14 DIAGNOSIS — A498 Other bacterial infections of unspecified site: Secondary | ICD-10-CM

## 2022-08-14 NOTE — Telephone Encounter (Signed)
Is there a different way to send a throat culture? That is the only order Suli could find last week

## 2022-08-14 NOTE — Telephone Encounter (Signed)
She called after hours and was switched to Augmentin over the weekend.

## 2022-08-15 ENCOUNTER — Ambulatory Visit: Payer: BC Managed Care – PPO

## 2022-08-20 ENCOUNTER — Encounter: Payer: Self-pay | Admitting: Family Medicine

## 2022-08-20 ENCOUNTER — Ambulatory Visit (INDEPENDENT_AMBULATORY_CARE_PROVIDER_SITE_OTHER): Payer: BC Managed Care – PPO | Admitting: Family Medicine

## 2022-08-20 VITALS — BP 119/75 | HR 102 | Temp 97.9°F | Resp 16 | Wt 198.7 lb

## 2022-08-20 DIAGNOSIS — H9201 Otalgia, right ear: Secondary | ICD-10-CM

## 2022-08-20 DIAGNOSIS — Z9089 Acquired absence of other organs: Secondary | ICD-10-CM | POA: Diagnosis not present

## 2022-08-20 DIAGNOSIS — Z8709 Personal history of other diseases of the respiratory system: Secondary | ICD-10-CM | POA: Diagnosis not present

## 2022-08-20 DIAGNOSIS — J301 Allergic rhinitis due to pollen: Secondary | ICD-10-CM | POA: Diagnosis not present

## 2022-08-20 DIAGNOSIS — R590 Localized enlarged lymph nodes: Secondary | ICD-10-CM | POA: Diagnosis not present

## 2022-08-20 DIAGNOSIS — A498 Other bacterial infections of unspecified site: Secondary | ICD-10-CM | POA: Diagnosis not present

## 2022-08-20 NOTE — Progress Notes (Signed)
I,Sulibeya S Dimas,acting as a Neurosurgeon for Shirlee Latch, MD.,have documented all relevant documentation on the behalf of Shirlee Latch, MD,as directed by  Shirlee Latch, MD while in the presence of Shirlee Latch, MD.    Established patient visit   Patient: Erica Scott   DOB: [birth year]   22 y.o. Female  MRN: [id number] Visit Date: 08/20/2022  Today's healthcare provider: Shirlee Latch, MD   Chief Complaint  Patient presents with   Ear Pain   Subjective    HPI  Follow up for ear pain  The patient was last seen for this 2 weeks ago. Changes made at last visit include starting doxycycline. Changed to Augmentin.   She reports excellent compliance with treatment. She feels that condition is Unchanged. She is not having side effects.   ---------------------------------------------------------------------------------------- Discussed the use of AI scribe software for clinical note transcription with the patient, who gave verbal consent to proceed.  History of Present Illness   The patient, with a history of a right ear infection, presents for follow-up. She was initially treated with doxycycline, which was changed to Augmentin due to 'intense stomach pains' occurring 'like thirty minutes after' taking the doxycycline. The Augmentin was tolerated well. Despite treatment, the patient reports that the ear pain is 'maybe fifty percent better.' She also notes a swollen lymph node in front of the right ear, which was previously 'really swollen and painful' but is now 'a lot smaller.'       Medications: Outpatient Medications Prior to Visit  Medication Sig   fexofenadine (ALLEGRA) 180 MG tablet Take 180 mg by mouth daily.   fluticasone (FLONASE) 50 MCG/ACT nasal spray Place 1 spray into both nostrils 2 (two) times daily.   montelukast (SINGULAIR) 10 MG tablet Take 1 tablet (10 mg total) by mouth at bedtime.   norethindrone (AYGESTIN) 5 MG tablet    sertraline  (ZOLOFT) 50 MG tablet Take 1 tablet (50 mg total) by mouth daily.   No facility-administered medications prior to visit.    Review of Systems  Constitutional:  Negative for chills and fever.  HENT:  Positive for ear pain. Negative for ear discharge and rhinorrhea.   Respiratory:  Negative for cough, chest tightness and shortness of breath.        Objective    BP 119/75 (BP Location: Left Arm, Patient Position: Sitting, Cuff Size: Large)   Pulse (!) 102   Temp 97.9 F (36.6 C) (Temporal)   Resp 16   Wt 198 lb 11.2 oz (90.1 kg)   LMP 08/02/2022 (Exact Date)   SpO2 99%   BMI 31.12 kg/m    Physical Exam  Physical Exam   HEENT: Right ear infection improved. Eardrum appears better. Left ear normal. NECK: pre-auricular Lymph node swollen, decreased in size and tenderness.       No results found for any visits on 08/20/22.  Assessment & Plan     Assessment and Plan    Right AOM: Improved but not resolved after treatment with Doxycycline and Augmentin. No current signs of infection on examination. -Continue to monitor symptoms. -Use Tylenol or Ibuprofen for pain as needed. -Return if symptoms worsen or fever develops.  Swollen Lymph Node: Noted in front of the right ear, likely secondary to recent ear infection. Decreased in size and tenderness compared to previous visit. -Continue to monitor. -Return if swelling increases or becomes more painful.  Throat Culture: Requested by patient to avoid additional nurse visit.  was ordered at last visit -  Collect throat culture today.  Follow-up: As needed for worsening ear pain or lymph node swelling.       Return if symptoms worsen or fail to improve.      I, Shirlee Latch, MD, have reviewed all documentation for this visit. The documentation on 08/20/22 for the exam, diagnosis, procedures, and orders are all accurate and complete.   Sheretta Grumbine, Marzella Schlein, MD, MPH [facility]

## 2022-08-24 ENCOUNTER — Telehealth: Payer: Self-pay | Admitting: Family Medicine

## 2022-08-24 ENCOUNTER — Encounter: Payer: Self-pay | Admitting: Family Medicine

## 2022-08-24 LAB — ANAEROBIC AND AEROBIC CULTURE

## 2022-08-24 NOTE — Telephone Encounter (Signed)
Pt called in has questions about the lab result notes she saw on 06/03. Please call back

## 2022-08-27 DIAGNOSIS — J301 Allergic rhinitis due to pollen: Secondary | ICD-10-CM | POA: Diagnosis not present

## 2022-09-04 DIAGNOSIS — J301 Allergic rhinitis due to pollen: Secondary | ICD-10-CM | POA: Diagnosis not present

## 2022-09-12 ENCOUNTER — Encounter: Payer: Self-pay | Admitting: Family Medicine

## 2022-09-14 DIAGNOSIS — N6310 Unspecified lump in the right breast, unspecified quadrant: Secondary | ICD-10-CM | POA: Diagnosis not present

## 2022-09-14 DIAGNOSIS — N6321 Unspecified lump in the left breast, upper outer quadrant: Secondary | ICD-10-CM | POA: Diagnosis not present

## 2022-09-14 DIAGNOSIS — N6320 Unspecified lump in the left breast, unspecified quadrant: Secondary | ICD-10-CM | POA: Diagnosis not present

## 2022-09-14 DIAGNOSIS — N6312 Unspecified lump in the right breast, upper inner quadrant: Secondary | ICD-10-CM | POA: Diagnosis not present

## 2022-09-17 DIAGNOSIS — J301 Allergic rhinitis due to pollen: Secondary | ICD-10-CM | POA: Diagnosis not present

## 2022-09-24 ENCOUNTER — Encounter: Payer: Self-pay | Admitting: Family Medicine

## 2022-09-24 ENCOUNTER — Ambulatory Visit: Payer: BC Managed Care – PPO | Admitting: Family Medicine

## 2022-09-24 VITALS — BP 118/81 | HR 91 | Temp 97.3°F | Resp 12 | Ht 67.0 in | Wt 196.2 lb

## 2022-09-24 DIAGNOSIS — J301 Allergic rhinitis due to pollen: Secondary | ICD-10-CM | POA: Diagnosis not present

## 2022-09-24 DIAGNOSIS — R300 Dysuria: Secondary | ICD-10-CM

## 2022-09-24 LAB — POCT URINALYSIS DIPSTICK
Bilirubin, UA: NEGATIVE
Blood, UA: NEGATIVE
Glucose, UA: NEGATIVE
Ketones, UA: NEGATIVE
Leukocytes, UA: NEGATIVE
Nitrite, UA: NEGATIVE
Protein, UA: NEGATIVE
Spec Grav, UA: 1.01 (ref 1.010–1.025)
Urobilinogen, UA: 0.2 E.U./dL
pH, UA: 6.5 (ref 5.0–8.0)

## 2022-09-24 NOTE — Progress Notes (Signed)
I,Sulibeya S Dimas,acting as a Neurosurgeon for Shirlee Latch, MD.,have documented all relevant documentation on the behalf of Shirlee Latch, MD,as directed by  Shirlee Latch, MD while in the presence of Shirlee Latch, MD.     Established patient visit   Patient: Erica Scott   DOB: 04/18/2000   22 y.o. Female  MRN: [id number] Visit Date: 09/24/2022  Today's healthcare provider: Shirlee Latch, MD   Chief Complaint  Patient presents with   Urinary Tract Infection   Subjective    HPI  Urinary symptoms  She reports new onset cloudy malodorous urine, dysuria, flank pain, and urinary frequency. The current episode started  3-4 days ago and is staying constant. Patient states symptoms are mild in intensity, occurring constantly. She  has not been recently treated for similar symptoms.    Associated symptoms: No abdominal pain Yes back pain  No chills No constipation  No cramping No diarrhea  No discharge No fever  No hematuria Yes nausea  No vomiting    --------------------------------------------------------------------------------------- Discussed the use of AI scribe software for clinical note transcription with the patient, who gave verbal consent to proceed.  History of Present Illness   The patient presents with a 3-4 day history of urinary symptoms including cloudy, malodorous urine, dysuria, flank pain, and increased urinary frequency. She describes the discomfort as 'mild but constant.' She denies fever. The patient has a history of multiple UTIs and reports that the current symptoms are similar to previous episodes. However, she also notes that she is due to start her menstrual period and is unsure if the discomfort could be related to this.  In addition to the urinary symptoms, the patient also mentions a recent throat culture that was performed after a course of Augmentin. She expresses relief that the results were normal. She also mentions a scheduled  physical examination and the need to bring paperwork for this appointment.         Medications: Outpatient Medications Prior to Visit  Medication Sig   fexofenadine (ALLEGRA) 180 MG tablet Take 180 mg by mouth daily.   fluticasone (FLONASE) 50 MCG/ACT nasal spray Place 1 spray into both nostrils 2 (two) times daily.   montelukast (SINGULAIR) 10 MG tablet Take 1 tablet (10 mg total) by mouth at bedtime.   norethindrone (AYGESTIN) 5 MG tablet    sertraline (ZOLOFT) 50 MG tablet Take 1 tablet (50 mg total) by mouth daily.   No facility-administered medications prior to visit.    Review of Systems  Gastrointestinal:  Positive for nausea. Negative for abdominal pain, blood in stool, constipation, diarrhea and vomiting.  Genitourinary:  Positive for dysuria, flank pain and frequency. Negative for hematuria.       Objective    BP 118/81 (BP Location: Left Arm, Patient Position: Sitting, Cuff Size: Large)   Pulse 91   Temp (!) 97.3 F (36.3 C) (Temporal)   Resp 12   Ht 5\' 7"  (1.702 m)   Wt 196 lb 3.2 oz (89 kg)   LMP 08/30/2022 (Exact Date)   BMI 30.73 kg/m  BP Readings from Last 3 Encounters:  09/24/22 118/81  08/20/22 119/75  08/09/22 126/85   Wt Readings from Last 3 Encounters:  09/24/22 196 lb 3.2 oz (89 kg)  08/20/22 198 lb 11.2 oz (90.1 kg)  08/09/22 194 lb 8 oz (88.2 kg)      Physical Exam Vitals reviewed.  Constitutional:      General: She is not in acute distress.  Appearance: She is well-developed.  HENT:     Head: Normocephalic and atraumatic.  Eyes:     General: No scleral icterus.    Conjunctiva/sclera: Conjunctivae normal.  Cardiovascular:     Rate and Rhythm: Normal rate and regular rhythm.     Heart sounds: Normal heart sounds. No murmur heard. Pulmonary:     Effort: Pulmonary effort is normal. No respiratory distress.     Breath sounds: Normal breath sounds. No wheezing or rales.  Abdominal:     General: There is no distension.      Palpations: Abdomen is soft.     Tenderness: There is no abdominal tenderness. There is no right CVA tenderness, left CVA tenderness, guarding or rebound.  Skin:    General: Skin is warm and dry.     Capillary Refill: Capillary refill takes less than 2 seconds.     Findings: No rash.  Neurological:     Mental Status: She is alert and oriented to person, place, and time.  Psychiatric:        Behavior: Behavior normal.       Results for orders placed or performed in visit on 09/24/22  POCT urinalysis dipstick  Result Value Ref Range   Color, UA yellow    Clarity, UA clear    Glucose, UA Negative Negative   Bilirubin, UA Negative    Ketones, UA Negative    Spec Grav, UA 1.010 1.010 - 1.025   Blood, UA Negative    pH, UA 6.5 5.0 - 8.0   Protein, UA Negative Negative   Urobilinogen, UA 0.2 0.2 or 1.0 E.U./dL   Nitrite, UA Negative    Leukocytes, UA Negative Negative   Appearance     Odor      Assessment & Plan     Problem List Items Addressed This Visit   None Visit Diagnoses     Dysuria    -  Primary   Relevant Orders   POCT urinalysis dipstick (Completed)   Urine Culture         Suspected Urinary Tract Infection: Reports of cloudy, smelly urine, dysuria, and flank pain for 3-4 days. Urinalysis normal, but due to symptoms, urine culture ordered to confirm or rule out UTI. -Send urine for culture. -If symptoms persist, consider over-the-counter Azo for symptomatic relief. -Plan based on culture results.  Follow-up: Physical scheduled for August 8th. -Bring necessary paperwork for physical.        Return for CPE, as scheduled.      I, Shirlee Latch, MD, have reviewed all documentation for this visit. The documentation on 09/24/22 for the exam, diagnosis, procedures, and orders are all accurate and complete.   Erica Scott, Marzella Schlein, MD, MPH [facility]

## 2022-09-25 ENCOUNTER — Ambulatory Visit: Payer: BC Managed Care – PPO | Admitting: Family Medicine

## 2022-09-26 LAB — URINE CULTURE

## 2022-09-28 ENCOUNTER — Telehealth: Payer: Self-pay

## 2022-09-28 NOTE — Telephone Encounter (Signed)
Copied from CRM [id number]. Topic: General - Inquiry >> Sep 28, 2022  4:24 PM Patsy Lager T wrote: Reason for CRM: patient called requesting a copy of her vaccinations and she would like a call back when she can come by to pick it up

## 2022-09-28 NOTE — Telephone Encounter (Signed)
A CRM came in asking for a copy of her immunizations and to receive a call when she could come pick them up.  When I went to convert it to a phone message it attached to a mychart message and it closed before I could send it.   Pt needs vaccine record Thanks

## 2022-10-01 ENCOUNTER — Encounter: Payer: Self-pay | Admitting: Family Medicine

## 2022-10-01 ENCOUNTER — Ambulatory Visit: Payer: BC Managed Care – PPO | Admitting: Family Medicine

## 2022-10-01 NOTE — Telephone Encounter (Signed)
Patient was informed it will be at the front for to pick up, states she will pick up next week when she is back in town.

## 2022-10-11 ENCOUNTER — Encounter: Payer: Self-pay | Admitting: Family Medicine

## 2022-10-11 ENCOUNTER — Ambulatory Visit: Payer: BC Managed Care – PPO | Admitting: Family Medicine

## 2022-10-16 DIAGNOSIS — H5213 Myopia, bilateral: Secondary | ICD-10-CM | POA: Diagnosis not present

## 2022-10-22 DIAGNOSIS — J301 Allergic rhinitis due to pollen: Secondary | ICD-10-CM | POA: Diagnosis not present

## 2022-10-25 ENCOUNTER — Encounter: Payer: BC Managed Care – PPO | Admitting: Family Medicine

## 2022-10-29 ENCOUNTER — Ambulatory Visit (INDEPENDENT_AMBULATORY_CARE_PROVIDER_SITE_OTHER): Payer: BC Managed Care – PPO | Admitting: Family Medicine

## 2022-10-29 ENCOUNTER — Encounter: Payer: Self-pay | Admitting: Family Medicine

## 2022-10-29 VITALS — BP 125/75 | HR 104 | Temp 97.6°F | Resp 12 | Ht 67.0 in | Wt 196.7 lb

## 2022-10-29 DIAGNOSIS — Z1159 Encounter for screening for other viral diseases: Secondary | ICD-10-CM

## 2022-10-29 DIAGNOSIS — Z13220 Encounter for screening for lipoid disorders: Secondary | ICD-10-CM

## 2022-10-29 DIAGNOSIS — Z0000 Encounter for general adult medical examination without abnormal findings: Secondary | ICD-10-CM | POA: Diagnosis not present

## 2022-10-29 DIAGNOSIS — F411 Generalized anxiety disorder: Secondary | ICD-10-CM

## 2022-10-29 DIAGNOSIS — Z114 Encounter for screening for human immunodeficiency virus [HIV]: Secondary | ICD-10-CM

## 2022-10-29 DIAGNOSIS — Z020 Encounter for examination for admission to educational institution: Secondary | ICD-10-CM

## 2022-10-29 DIAGNOSIS — J309 Allergic rhinitis, unspecified: Secondary | ICD-10-CM

## 2022-10-29 DIAGNOSIS — Z8709 Personal history of other diseases of the respiratory system: Secondary | ICD-10-CM

## 2022-10-29 DIAGNOSIS — Z111 Encounter for screening for respiratory tuberculosis: Secondary | ICD-10-CM | POA: Diagnosis not present

## 2022-10-29 DIAGNOSIS — J329 Chronic sinusitis, unspecified: Secondary | ICD-10-CM

## 2022-10-29 LAB — POCT URINALYSIS DIPSTICK
Bilirubin, UA: NEGATIVE
Glucose, UA: NEGATIVE
Ketones, UA: NEGATIVE
Leukocytes, UA: NEGATIVE
Nitrite, UA: NEGATIVE
Protein, UA: NEGATIVE
Spec Grav, UA: <=1.005 — AB (ref 1.010–1.025)
Urobilinogen, UA: 0.2 E.U./dL
pH, UA: 7.5 (ref 5.0–8.0)

## 2022-10-29 MED ORDER — AMOXICILLIN-POT CLAVULANATE 875-125 MG PO TABS: 1.0000 | ORAL_TABLET | Freq: Two times a day (BID) | ORAL | 0 refills | Status: AC

## 2022-10-29 NOTE — Assessment & Plan Note (Signed)
Chronic and uncontrolled  Is anxious about starting Zoloft due to side effect profile  Take 1/2 dose Zoloft for 2 weeks then increased to full dose  F/u in 2 months

## 2022-10-29 NOTE — Assessment & Plan Note (Deleted)
Chronic and uncontrolled  Is anxious about starting Zoloft due to side effect profile  Take 1/2 dose Zoloft for 2 weeks then increased to full dose  F/u in 2 months

## 2022-10-29 NOTE — Assessment & Plan Note (Signed)
Chronic and worsening  Continue Flonase, singular, allegra, and sudafed Follow up with ENT (already established patient)

## 2022-10-29 NOTE — Assessment & Plan Note (Signed)
Chronic despite preventative medical therapies  Take Augmentin BID for 7 days

## 2022-10-29 NOTE — Progress Notes (Signed)
Complete physical exam  Patient: Erica Scott   DOB: Dec 18, 2000   21 y.o. Female  MRN: [id number]  Subjective:    Chief Complaint  Patient presents with   Annual Exam    Erica Scott is a 22 y.o. female who presents today for a complete physical exam. She reports consuming a general diet. Home exercise routine includes walking. She generally feels fairly well. She reports sleeping fairly well. She does have additional problems to discuss today.    Most recent fall risk assessment:    09/24/2022    3:05 PM  Fall Risk   Falls in the past year? 0  Number falls in past yr: 0  Injury with Fall? 0  Risk for fall due to : No Fall Risks  Follow up Falls evaluation completed     Most recent depression screenings:    10/29/2022    2:17 PM 09/24/2022    3:05 PM  PHQ 2/9 Scores  PHQ - 2 Score 1 1  PHQ- 9 Score 1 2    Patient Active Problem List   Diagnosis Date Noted   GAD (generalized anxiety disorder) 08/09/2022   Allergic rhinitis 10/25/2020   History of sore throat 10/25/2020   Status post tonsillectomy 10/25/2020      Patient Care Team: Erasmo Downer, MD as PCP - General (Family Medicine)   Outpatient Medications Prior to Visit  Medication Sig   fexofenadine (ALLEGRA) 180 MG tablet Take 180 mg by mouth daily.   fluticasone (FLONASE) 50 MCG/ACT nasal spray Place 1 spray into both nostrils 2 (two) times daily.   montelukast (SINGULAIR) 10 MG tablet Take 1 tablet (10 mg total) by mouth at bedtime.   norethindrone (AYGESTIN) 5 MG tablet    sertraline (ZOLOFT) 50 MG tablet Take 1 tablet (50 mg total) by mouth daily.   No facility-administered medications prior to visit.    Review of Systems  HENT:  Positive for congestion and sinus pain.   Psychiatric/Behavioral:  Positive for depression. Negative for suicidal ideas. The patient is nervous/anxious.       Objective:     BP 125/75 (BP Location: Left Arm, Patient Position: Sitting, Cuff Size: Large)    Pulse (!) 104   Temp 97.6 F (36.4 C) (Temporal)   Resp 12   Ht 5\' 7"  (1.702 m)   Wt 196 lb 11.2 oz (89.2 kg)   LMP 10/28/2022 (Exact Date)   BMI 30.81 kg/m    Physical Exam Constitutional:      Appearance: Normal appearance.  HENT:     Head: Normocephalic and atraumatic.     Right Ear: Tympanic membrane, ear canal and external ear normal.     Left Ear: Tympanic membrane, ear canal and external ear normal.     Nose: Congestion present.     Right Sinus: Maxillary sinus tenderness present.     Left Sinus: Maxillary sinus tenderness present.     Mouth/Throat:     Mouth: Mucous membranes are moist.     Pharynx: Oropharynx is clear.  Eyes:     Conjunctiva/sclera: Conjunctivae normal.     Pupils: Pupils are equal, round, and reactive to light.  Cardiovascular:     Rate and Rhythm: Normal rate and regular rhythm.     Heart sounds: Normal heart sounds.  Pulmonary:     Effort: Pulmonary effort is normal.     Breath sounds: Normal breath sounds.  Musculoskeletal:        General: No swelling.  Lymphadenopathy:     Cervical: No cervical adenopathy.  Neurological:     General: No focal deficit present.     Mental Status: She is alert and oriented to person, place, and time.     No results found for any visits on 10/29/22.    Assessment & Plan:    Routine Health Maintenance and Physical Exam  Immunization History  Administered Date(s) Administered   DTaP 03/07/2001, 05/07/2001, 07/15/2001, 04/06/2002, 01/03/2005   HIB (PRP-OMP) 03/07/2001, 05/07/2001, 07/15/2001, 04/06/2002   HPV 9-valent 01/21/2012, 03/26/2012, 07/28/2012   Hep B, Unspecified 09-Sep-2000, 02/03/2001, 07/15/2001   Hepatitis A, Ped/Adol-2 Dose 12/15/2013, 06/16/2014   Hepb-cpg 01/11/2022, 02/13/2022   IPV 03/07/2001, 05/07/2001, 01/05/2002, 01/03/2005   Influenza Inj Mdck Quad Pf 11/27/2018   Influenza Nasal 03/06/2007, 03/08/2008, 01/27/2009, 01/07/2010, 11/22/2010   Influenza,inj,Quad PF,6+ Mos  12/07/2016, 12/07/2017   Influenza-Unspecified 03/26/2019, 12/07/2021   MMR 01/05/2002, 01/03/2005   Meningococcal B, OMV 01/08/2017, 02/15/2017   Meningococcal Mcv4o 03/26/2012, 04/12/2017   PFIZER(Purple Top)SARS-COV-2 Vaccination 05/31/2019, 06/22/2019, 01/11/2020   PPD Test 04/14/2021, 04/24/2021   Pfizer Covid-19 Vaccine Bivalent Booster 77yrs & up 11/28/2020, 12/13/2021   Pneumococcal Conjugate PCV 7 03/07/2001, 05/07/2001, 07/15/2001, 04/06/2002   Tdap 01/21/2012, 03/08/2021   Varicella 01/05/2002, 01/07/2006    Health Maintenance  Topic Date Due   HIV Screening  Never done   Hepatitis C Screening  Never done   PAP-Cervical Cytology Screening  Never done   PAP SMEAR-Modifier  Never done   COVID-19 Vaccine (6 - 2023-24 season) 02/07/2022   INFLUENZA VACCINE  10/18/2022   DTaP/Tdap/Td (8 - Td or Tdap) 03/09/2031   HPV VACCINES  Completed    Discussed health benefits of physical activity, and encouraged her to engage in regular exercise appropriate for her age and condition.  Problem List Items Addressed This Visit       Respiratory   Allergic rhinitis    Chronic and worsening  Continue Flonase, singular, allegra, and sudafed Follow up with ENT (already established patient)      Recurrent sinusitis    Chronic despite preventative medical therapies  Take Augmentin BID for 7 days       Relevant Medications   amoxicillin-clavulanate (AUGMENTIN) 875-125 MG tablet     Other   History of sore throat   GAD (generalized anxiety disorder)    Chronic and uncontrolled  Is anxious about starting Zoloft due to side effect profile  Take 1/2 dose Zoloft for 2 weeks then increased to full dose  F/u in 2 months       Other Visit Diagnoses     Encounter for annual physical exam    -  Primary   Relevant Orders   QuantiFERON-TB Gold Plus   CBC w/Diff/Platelet   Hepatitis C Antibody   HIV antibody (with reflex)   Comprehensive metabolic panel   Lipid panel   POCT  Urinalysis Dipstick   Need for hepatitis C screening test       Relevant Orders   Hepatitis C Antibody   Encounter for screening for HIV       Relevant Orders   HIV antibody (with reflex)   Screening for lipid disorders       Relevant Orders   Comprehensive metabolic panel   Lipid panel   Tuberculosis screening       Relevant Orders   QuantiFERON-TB Gold Plus   School health examination       Relevant Orders   QuantiFERON-TB Gold Plus   CBC  w/Diff/Platelet   POCT Urinalysis Dipstick      Return in about 2 months (around 12/29/2022) for MDD/GAD f/u, virtual ok.     Rometta Emery, Medical Student  Patient seen along with MS3 student Jodi Marble. I personally evaluated this patient along with the student, and verified all aspects of the history, physical exam, and medical decision making as documented by the student. I agree with the student's documentation and have made all necessary edits.  Dequane Strahan, Marzella Schlein, MD, MPH [facility]

## 2022-10-31 ENCOUNTER — Encounter: Payer: Self-pay | Admitting: Family Medicine

## 2022-11-20 ENCOUNTER — Ambulatory Visit: Payer: BC Managed Care – PPO | Admitting: Family Medicine

## 2022-11-21 DIAGNOSIS — J301 Allergic rhinitis due to pollen: Secondary | ICD-10-CM | POA: Diagnosis not present

## 2022-11-22 ENCOUNTER — Ambulatory Visit: Payer: BC Managed Care – PPO | Admitting: Family Medicine

## 2022-11-27 ENCOUNTER — Encounter: Payer: Self-pay | Admitting: Family Medicine

## 2022-11-29 ENCOUNTER — Ambulatory Visit: Payer: BC Managed Care – PPO | Admitting: Physician Assistant

## 2022-11-29 ENCOUNTER — Encounter: Payer: Self-pay | Admitting: Physician Assistant

## 2022-11-29 VITALS — BP 132/86 | HR 93 | Temp 98.8°F | Ht 67.0 in | Wt 196.1 lb

## 2022-11-29 DIAGNOSIS — J029 Acute pharyngitis, unspecified: Secondary | ICD-10-CM

## 2022-11-29 DIAGNOSIS — R0989 Other specified symptoms and signs involving the circulatory and respiratory systems: Secondary | ICD-10-CM

## 2022-11-29 DIAGNOSIS — R059 Cough, unspecified: Secondary | ICD-10-CM | POA: Diagnosis not present

## 2022-11-29 DIAGNOSIS — H9209 Otalgia, unspecified ear: Secondary | ICD-10-CM | POA: Diagnosis not present

## 2022-11-29 DIAGNOSIS — R6889 Other general symptoms and signs: Secondary | ICD-10-CM

## 2022-11-29 LAB — POCT INFLUENZA A/B
Influenza A, POC: NEGATIVE
Influenza B, POC: NEGATIVE

## 2022-11-29 LAB — POCT RAPID STREP A (OFFICE): Rapid Strep A Screen: NEGATIVE

## 2022-11-29 LAB — POC COVID19 BINAXNOW: SARS Coronavirus 2 Ag: NEGATIVE

## 2022-11-29 NOTE — Progress Notes (Signed)
Established patient visit  Patient: Erica Scott   DOB: 27-Oct-2000   22 y.o. Female  MRN: [id number] Visit Date: 11/29/2022  Today's healthcare provider: Debera Lat, PA-C   Chief Complaint  Patient presents with   URI    Taking sudafed. Last taken this morning. Covid test a few days ago and was negative.    Subjective         URI   Associated symptoms inlclude congestion, cough, ear pain and sore throat.  Recent episode started in the past 7 days (11/27/22).  The problem has been gradually worsening since onset.  The temperature has been with in normal range.  Patient  is drinking plenty of fluids.  Past hisotry is significant for  no history of pneumonia or bronchitis.  Patient is not a smoker. Additional comments: Taking sudaffed. Last taken this morning. Covid test a few days ago and was negative.        Discussed the use of AI scribe software for clinical note transcription with the patient, who gave verbal consent to proceed.  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 She reports congestion, cough, ear pain, and a sore throat. The patient describes the throat pain as increasing in severity, possibly due to postnasal drainage. She also reports ear pain and a sensation of clogged ears, which has led to a perceived decrease in hearing. The patient denies having seasonal allergies. She is currently using Flonase for nasal congestion but reports persistent pain in the nasal area.           10/29/2022    2:17 PM 09/24/2022    3:05 PM 08/09/2022    4:10 PM  Depression screen PHQ 2/9  Decreased Interest 0 0 0  Down, Depressed, Hopeless 1 1 0  PHQ - 2 Score 1 1 0  Altered sleeping 0 1 3  Tired, decreased energy 0 0 1  Change in appetite 0 0 0  Feeling bad or failure about yourself  0 0 0  Trouble concentrating 0 0 0  Moving slowly or fidgety/restless 0 0 0   Suicidal thoughts 0 0 0  PHQ-9 Score 1 2 4   Difficult doing work/chores Somewhat difficult Not difficult at all Not difficult at all      08/09/2022    4:11 PM  GAD 7 : Generalized Anxiety Score  Nervous, Anxious, on Edge 3  Control/stop worrying 1  Worry too much - different things 3  Trouble relaxing 1  Restless 0  Easily annoyed or irritable 0  Afraid - awful might happen 2  Total GAD 7 Score 10  Anxiety Difficulty Not difficult at all    Medications: Outpatient Medications Prior to Visit  Medication Sig   fexofenadine (ALLEGRA) 180 MG tablet Take 180 mg by mouth daily.   fluticasone (FLONASE) 50 MCG/ACT nasal spray Place 1 spray into both nostrils 2 (two) times daily.   montelukast (SINGULAIR) 10 MG tablet Take 1 tablet (10 mg total) by mouth at bedtime.   norethindrone (AYGESTIN) 5 MG tablet    sertraline (ZOLOFT) 50 MG tablet Take 1 tablet (50 mg total) by mouth daily.   No facility-administered medications prior to visit.    Review of Systems  All other systems reviewed and are negative.  Except see HPI       Objective    BP 132/86 (BP Location: Right Arm, Patient Position: Sitting, Cuff  Size: Normal)   Pulse 93   Temp 98.8 F (37.1 C) (Oral)   Ht 5\' 7"  (1.702 m)   Wt 196 lb 1.6 oz (89 kg)   SpO2 99%   BMI 30.71 kg/m     Physical Exam Vitals reviewed.  Constitutional:      General: She is not in acute distress.    Appearance: Normal appearance. She is well-developed. She is not diaphoretic.  HENT:     Head: Normocephalic and atraumatic.     Right Ear: Ear canal and external ear normal.     Left Ear: Ear canal and external ear normal.     Ears:     Comments: Fluids behind the tms    Nose: Congestion and rhinorrhea present.     Mouth/Throat:     Pharynx: Posterior oropharyngeal erythema (very mild) present.  Eyes:     General: No scleral icterus.       Right eye: No discharge.        Left eye: No discharge.     Extraocular Movements:  Extraocular movements intact.     Conjunctiva/sclera: Conjunctivae normal.     Pupils: Pupils are equal, round, and reactive to light.  Neck:     Thyroid: No thyromegaly.  Cardiovascular:     Rate and Rhythm: Normal rate and regular rhythm.     Pulses: Normal pulses.     Heart sounds: Normal heart sounds. No murmur heard. Pulmonary:     Effort: Pulmonary effort is normal. No respiratory distress.     Breath sounds: Normal breath sounds. No wheezing, rhonchi or rales.  Musculoskeletal:     Cervical back: Normal range of motion and neck supple. No tenderness.     Right lower leg: No edema.     Left lower leg: No edema.  Lymphadenopathy:     Cervical: No cervical adenopathy.  Skin:    General: Skin is warm and dry.     Findings: No rash.  Neurological:     Mental Status: She is alert and oriented to person, place, and time. Mental status is at baseline.  Psychiatric:        Mood and Affect: Mood normal.        Behavior: Behavior normal.      Results for orders placed or performed in visit on 11/29/22  POCT rapid strep A  Result Value Ref Range   Rapid Strep A Screen Negative Negative  POC COVID-19  Result Value Ref Range   SARS Coronavirus 2 Ag Negative Negative  POCT Influenza A/B  Result Value Ref Range   Influenza A, POC Negative Negative   Influenza B, POC Negative Negative    Assessment & Plan        Upper Respiratory Infection Symptoms started on Tuesday with worsening sore throat and ear pain. No fever. No known exposure to sick individuals. 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 -Drink plenty of water and consume soft foods. -Use warm salt gargle with a bit of baking soda and tea tree oil for sore throat. -Use nasal saline rinse or spray before applying Flonase for nasal congestion. -Take OTC Allegra or Claritin or Zyrtec -Take Mucinex for cough. -Use decongestant for cough. -Use rubbing alcohol massage on feet after shower (if no  fever). -Return to work on 12/02/2022.      No follow-ups on file.     The patient was advised to call back or seek an in-person evaluation if the symptoms  worsen or if the condition fails to improve as anticipated.  I discussed the assessment and treatment plan with the patient. The patient was provided an opportunity to ask questions and all were answered. The patient agreed with the plan and demonstrated an understanding of the instructions.  I, Debera Lat, PA-C have reviewed all documentation for this visit. The documentation on 11/29/22 for the exam, diagnosis, procedures, and orders are all accurate and complete.  Debera Lat, [facility], MMS [facility] [phone] (phone) [phone] (fax)  [facility]

## 2022-12-02 ENCOUNTER — Encounter: Payer: Self-pay | Admitting: Family Medicine

## 2022-12-04 ENCOUNTER — Ambulatory Visit: Payer: BC Managed Care – PPO | Admitting: Family Medicine

## 2022-12-04 ENCOUNTER — Encounter: Payer: Self-pay | Admitting: Family Medicine

## 2022-12-04 VITALS — BP 115/63 | HR 90 | Ht 67.0 in | Wt 195.3 lb

## 2022-12-04 DIAGNOSIS — J329 Chronic sinusitis, unspecified: Secondary | ICD-10-CM | POA: Diagnosis not present

## 2022-12-04 DIAGNOSIS — R051 Acute cough: Secondary | ICD-10-CM | POA: Diagnosis not present

## 2022-12-04 DIAGNOSIS — R0981 Nasal congestion: Secondary | ICD-10-CM | POA: Diagnosis not present

## 2022-12-04 LAB — POCT INFLUENZA A/B
Influenza A, POC: NEGATIVE
Influenza B, POC: NEGATIVE

## 2022-12-04 LAB — POC COVID19 BINAXNOW: SARS Coronavirus 2 Ag: NEGATIVE

## 2022-12-04 MED ORDER — AMOXICILLIN-POT CLAVULANATE 875-125 MG PO TABS: 1.0000 | ORAL_TABLET | Freq: Two times a day (BID) | ORAL | 0 refills | Status: AC

## 2022-12-04 MED ORDER — IPRATROPIUM BROMIDE 0.03 % NA SOLN: 2.0000 | Freq: Two times a day (BID) | NASAL | 12 refills | Status: DC

## 2022-12-04 MED ORDER — BENZONATATE 100 MG PO CAPS: 100.0000 mg | ORAL_CAPSULE | Freq: Two times a day (BID) | ORAL | 0 refills | Status: DC | PRN

## 2022-12-04 NOTE — Assessment & Plan Note (Signed)
Acute on chronic  Discussed with patient the risk of resistance with taking abx 1 - 2x a month for long term  Offered prednisone as alternative to reduce inflammation and promote natural drainage  Patient refused prednisone due to worry it would not work, prefers abx  Take Augmentin BID for 7 days  Use Atrovent BID along with Flonase for sinus infection prevention  Tessalon pearls prn for cough  Patient will call ENT for further evaluation of recurrent sinusitis as she is an established patient

## 2022-12-04 NOTE — Assessment & Plan Note (Signed)
Use Atrovent BID  Take Augment BID for 7 days

## 2022-12-04 NOTE — Assessment & Plan Note (Signed)
Etiology likely related to nasal drainage  Use tessalon pearls prn for cough

## 2022-12-04 NOTE — Progress Notes (Signed)
Established Patient Office Visit  Subjective   Patient ID: Erica Scott, female    DOB: Feb 28, 2001  Age: 22 y.o. MRN: [id number]  Chief Complaint  Patient presents with   Medical Management of Chronic Issues    Cough, congestion, and sinus pressure have all become worse, prior treatment include sudifed and delsum     Erica Scott is here today for over a week history of congestion, cough, and sinus pressure. The congestion started about a week ago and was accompanied by a sore throat. She was seen here last week where she was diagnosed with a viral URI. At home remedies included sudafed and cough syrup have not helped.  Over the weekend her symptoms took a turn for the worse. She endorsed increasing sinus pressure, congestion, and develop of a wet cough productive of sputum.   Review of Systems  Constitutional:  Negative for chills and fever.  HENT:  Positive for congestion and sinus pain. Negative for ear discharge, ear pain and sore throat.   Respiratory:  Positive for cough and sputum production.   Psychiatric/Behavioral:  The patient has insomnia.      Objective:     BP 115/63 (BP Location: Left Arm, Patient Position: Sitting, Cuff Size: Large)   Pulse 90   Ht 5\' 7"  (1.702 m)   Wt 195 lb 4.8 oz (88.6 kg)   SpO2 100%   BMI 30.59 kg/m   Physical Exam Constitutional:      Appearance: Normal appearance.  HENT:     Right Ear: Tympanic membrane, ear canal and external ear normal.     Left Ear: Tympanic membrane, ear canal and external ear normal.     Mouth/Throat:     Mouth: Mucous membranes are moist.     Pharynx: Oropharynx is clear.  Eyes:     Conjunctiva/sclera: Conjunctivae normal.     Pupils: Pupils are equal, round, and reactive to light.  Cardiovascular:     Rate and Rhythm: Normal rate and regular rhythm.     Heart sounds: Normal heart sounds.  Pulmonary:     Effort: Pulmonary effort is normal.     Breath sounds: Normal breath sounds.  Neurological:      General: No focal deficit present.     Mental Status: She is alert and oriented to person, place, and time.    Results for orders placed or performed in visit on 12/04/22  POC COVID-19  Result Value Ref Range   SARS Coronavirus 2 Ag Negative Negative  POCT Influenza A/B  Result Value Ref Range   Influenza A, POC Negative Negative   Influenza B, POC Negative Negative    The ASCVD Risk score (Arnett DK, et al., 2019) failed to calculate for the following reasons:   The 2019 ASCVD risk score is only valid for ages 60 to 53    Assessment & Plan:   Problem List Items Addressed This Visit       Respiratory   Recurrent sinusitis    Acute on chronic  Discussed with patient the risk of resistance with taking abx 1 - 2x a month for long term  Offered prednisone as alternative to reduce inflammation and promote natural drainage  Patient refused prednisone due to worry it would not work, prefers abx  Take Augmentin BID for 7 days  Use Atrovent BID along with Flonase for sinus infection prevention  Tessalon pearls prn for cough  Patient will call ENT for further evaluation of recurrent sinusitis as she is an  established patient        Relevant Medications   ipratropium (ATROVENT) 0.03 % nasal spray   amoxicillin-clavulanate (AUGMENTIN) 875-125 MG tablet   benzonatate (TESSALON) 100 MG capsule   Sinus congestion    Use Atrovent BID  Take Augment BID for 7 days         Other   Acute cough - Primary    Etiology likely related to nasal drainage  Use tessalon pearls prn for cough        Relevant Orders   POC COVID-19 (Completed)   POCT Influenza A/B (Completed)    Return if symptoms worsen or fail to improve.    Rometta Emery, Medical Student  Patient seen along with MS3 student Jodi Marble. I personally evaluated this patient along with the student, and verified all aspects of the history, physical exam, and medical decision making as documented by the student. I  agree with the student's documentation and have made all necessary edits.  Nell Schrack, Marzella Schlein, MD, MPH [facility]

## 2022-12-06 ENCOUNTER — Ambulatory Visit: Payer: BC Managed Care – PPO | Admitting: Family Medicine

## 2022-12-20 ENCOUNTER — Encounter: Payer: Self-pay | Admitting: Family Medicine

## 2022-12-20 DIAGNOSIS — J301 Allergic rhinitis due to pollen: Secondary | ICD-10-CM | POA: Diagnosis not present

## 2022-12-27 DIAGNOSIS — J301 Allergic rhinitis due to pollen: Secondary | ICD-10-CM | POA: Diagnosis not present

## 2023-01-15 ENCOUNTER — Encounter: Payer: Self-pay | Admitting: Family Medicine

## 2023-01-15 NOTE — Telephone Encounter (Signed)
Ok to increase Zoloft to 100mg  daily - please send new rx for #30 r1. Then needs 6 week f/u (in person or virtual)

## 2023-01-16 ENCOUNTER — Other Ambulatory Visit: Payer: Self-pay

## 2023-01-16 MED ORDER — SERTRALINE HCL 100 MG PO TABS: 100.0000 mg | ORAL_TABLET | Freq: Every day | ORAL | 1 refills | Status: DC

## 2023-01-17 DIAGNOSIS — J301 Allergic rhinitis due to pollen: Secondary | ICD-10-CM | POA: Diagnosis not present

## 2023-01-24 DIAGNOSIS — J301 Allergic rhinitis due to pollen: Secondary | ICD-10-CM | POA: Diagnosis not present

## 2023-01-25 ENCOUNTER — Other Ambulatory Visit: Payer: Self-pay | Admitting: Family Medicine

## 2023-01-25 NOTE — Telephone Encounter (Signed)
Requested Prescriptions  Pending Prescriptions Disp Refills   montelukast (SINGULAIR) 10 MG tablet [Pharmacy Med Name: MONTELUKAST 10MG  TABLETS] 90 tablet 0    Sig: TAKE 1 TABLET(10 MG) BY MOUTH AT BEDTIME     Pulmonology:  Leukotriene Inhibitors Passed - 01/25/2023  9:16 AM      Passed - Valid encounter within last 12 months    Recent Outpatient Visits           1 month ago Acute cough   [place] [facility] [place], Marzella Schlein, MD   1 month ago Upper respiratory symptom   [place] [facility] [place], [place], PA-C   2 months ago Encounter for annual physical exam   [facility] [place], Marzella Schlein, MD   4 months ago Dysuria   [facility] [place], Marzella Schlein, MD   5 months ago Right ear pain    [facility] [place], Marzella Schlein, MD       Future Appointments             In 4 days Bacigalupo, Marzella Schlein, MD [facility], [facility]

## 2023-01-28 ENCOUNTER — Ambulatory Visit: Payer: BC Managed Care – PPO | Admitting: Family Medicine

## 2023-01-28 DIAGNOSIS — Z6829 Body mass index (BMI) 29.0-29.9, adult: Secondary | ICD-10-CM | POA: Diagnosis not present

## 2023-01-28 DIAGNOSIS — J018 Other acute sinusitis: Secondary | ICD-10-CM | POA: Diagnosis not present

## 2023-01-29 ENCOUNTER — Ambulatory Visit: Payer: BC Managed Care – PPO | Admitting: Family Medicine

## 2023-01-31 DIAGNOSIS — J301 Allergic rhinitis due to pollen: Secondary | ICD-10-CM | POA: Diagnosis not present

## 2023-02-07 ENCOUNTER — Ambulatory Visit: Payer: BC Managed Care – PPO | Admitting: Family Medicine

## 2023-02-07 ENCOUNTER — Ambulatory Visit: Payer: Self-pay

## 2023-02-07 ENCOUNTER — Encounter: Payer: Self-pay | Admitting: Nurse Practitioner

## 2023-02-07 ENCOUNTER — Ambulatory Visit: Payer: BC Managed Care – PPO | Admitting: Nurse Practitioner

## 2023-02-07 VITALS — BP 124/80 | HR 90 | Temp 98.2°F | Resp 18 | Ht 67.0 in | Wt 199.7 lb

## 2023-02-07 DIAGNOSIS — R300 Dysuria: Secondary | ICD-10-CM

## 2023-02-07 DIAGNOSIS — J301 Allergic rhinitis due to pollen: Secondary | ICD-10-CM | POA: Diagnosis not present

## 2023-02-07 LAB — POCT URINALYSIS DIPSTICK
Bilirubin, UA: NEGATIVE
Blood, UA: NEGATIVE
Glucose, UA: NEGATIVE
Ketones, UA: NEGATIVE
Leukocytes, UA: NEGATIVE
Nitrite, UA: NEGATIVE
Odor: NORMAL
Protein, UA: NEGATIVE
Spec Grav, UA: 1.025 (ref 1.010–1.025)
Urobilinogen, UA: 0.2 U/dL
pH, UA: 6 (ref 5.0–8.0)

## 2023-02-07 MED ORDER — NITROFURANTOIN MONOHYD MACRO 100 MG PO CAPS: 100.0000 mg | ORAL_CAPSULE | Freq: Two times a day (BID) | ORAL | 0 refills | Status: DC

## 2023-02-07 NOTE — Progress Notes (Signed)
BP 124/80   Pulse 90   Temp 98.2 F (36.8 C)   Resp 18   Ht 5\' 7"  (1.702 m)   Wt 199 lb 11.2 oz (90.6 kg)   SpO2 99%   BMI 31.28 kg/m    Subjective:    Patient ID: Erica Scott, female    DOB: 03-13-2001, 22 y.o.   MRN: [id number]  HPI: Erica Scott is a 23 y.o. female  Chief Complaint  Patient presents with   Urinary Tract Infection    Burning and frequency x1.5 weeks   Discussed the use of AI scribe software for clinical note transcription with the patient, who gave verbal consent to proceed.  History of Present Illness   The patient presents with urinary symptoms suggestive of a urinary tract infection (UTI). They report burning during urination, urinary frequency, and persistent symptoms despite adequate hydration. They also note some back pain. The patient has a history of UTIs, with the last episode occurring a couple of years ago. They have not required urology consultation for their UTIs in the past. The patient has previously been treated with sulfa drugs and nitrofurantoin, with good tolerance. They deny any vaginal discharge, sexual activity, or possibility of pregnancy. patient reports that she will have a normal urine in the office and then a positive culture.    Relevant past medical, surgical, family and social history reviewed and updated as indicated. Interim medical history since our last visit reviewed. Allergies and medications reviewed and updated.  Review of Systems  Ten systems reviewed and is negative except as mentioned in HPI       Objective:    BP 124/80   Pulse 90   Temp 98.2 F (36.8 C)   Resp 18   Ht 5\' 7"  (1.702 m)   Wt 199 lb 11.2 oz (90.6 kg)   SpO2 99%   BMI 31.28 kg/m   Wt Readings from Last 3 Encounters:  02/07/23 199 lb 11.2 oz (90.6 kg)  12/04/22 195 lb 4.8 oz (88.6 kg)  11/29/22 196 lb 1.6 oz (89 kg)    Physical Exam  Constitutional: Patient appears well-developed and well-nourished. Obese  No distress.   HEENT: head atraumatic, normocephalic, pupils equal and reactive to light, neck supple Cardiovascular: Normal rate, regular rhythm and normal heart sounds.  No murmur heard. No BLE edema. Pulmonary/Chest: Effort normal and breath sounds normal. No respiratory distress. Abdominal: Soft.  There is no tenderness. Right side CVA tenderness Psychiatric: Patient has a normal mood and affect. behavior is normal. Judgment and thought content normal.  Results for orders placed or performed in visit on 02/07/23  POCT urinalysis dipstick  Result Value Ref Range   Color, UA yellow    Clarity, UA clear    Glucose, UA Negative Negative   Bilirubin, UA neg    Ketones, UA neg    Spec Grav, UA 1.025 1.010 - 1.025   Blood, UA neg    pH, UA 6.0 5.0 - 8.0   Protein, UA Negative Negative   Urobilinogen, UA 0.2 0.2 or 1.0 E.U./dL   Nitrite, UA neg    Leukocytes, UA Negative Negative   Appearance clear    Odor normal       Assessment & Plan:   Problem List Items Addressed This Visit   None Visit Diagnoses     Dysuria    -  Primary   Relevant Medications   nitrofurantoin, macrocrystal-monohydrate, (MACROBID) 100 MG capsule   Other Relevant Orders  Urine Culture   POCT urinalysis dipstick (Completed)      Assessment and Plan    Urinary Tract Infection (UTI) Reports dysuria, urinary frequency, and back pain. No fever or abdominal pain. Urinalysis in office did not show any growth, but will send for culture given symptoms. History of UTIs in the past, but not recent. No history of urology consult. -Start Nitrofurantoin as empiric treatment. -If urine culture does not grow anything, stop Nitrofurantoin. -Encourage fluid intake. -Check urine culture results via MyChart over the weekend.         Follow up plan: Return if symptoms worsen or fail to improve.

## 2023-02-08 LAB — URINE CULTURE
MICRO NUMBER:: 15763610
SPECIMEN QUALITY:: ADEQUATE

## 2023-02-12 ENCOUNTER — Encounter: Payer: Self-pay | Admitting: Nurse Practitioner

## 2023-02-12 DIAGNOSIS — J301 Allergic rhinitis due to pollen: Secondary | ICD-10-CM | POA: Diagnosis not present

## 2023-02-13 ENCOUNTER — Other Ambulatory Visit: Payer: Self-pay | Admitting: Nurse Practitioner

## 2023-02-13 DIAGNOSIS — N898 Other specified noninflammatory disorders of vagina: Secondary | ICD-10-CM

## 2023-02-13 MED ORDER — FLUCONAZOLE 150 MG PO TABS: 150.0000 mg | ORAL_TABLET | ORAL | 0 refills | Status: DC | PRN

## 2023-03-03 ENCOUNTER — Encounter: Payer: Self-pay | Admitting: Nurse Practitioner

## 2023-03-06 ENCOUNTER — Ambulatory Visit: Payer: BC Managed Care – PPO | Admitting: Nurse Practitioner

## 2023-03-06 ENCOUNTER — Encounter: Payer: Self-pay | Admitting: Family Medicine

## 2023-03-06 ENCOUNTER — Other Ambulatory Visit: Payer: Self-pay | Admitting: Family Medicine

## 2023-03-07 NOTE — Telephone Encounter (Signed)
Requested Prescriptions  Pending Prescriptions Disp Refills   sertraline (ZOLOFT) 100 MG tablet [Pharmacy Med Name: SERTRALINE 100MG  TABLETS] 30 tablet 1    Sig: TAKE 1 TABLET(100 MG) BY MOUTH DAILY     Psychiatry:  Antidepressants - SSRI - sertraline Passed - 03/07/2023  7:34 AM      Passed - AST in normal range and within 360 days    AST  Date Value Ref Range Status  10/29/2022 19 0 - 40 IU/L Final         Passed - ALT in normal range and within 360 days    ALT  Date Value Ref Range Status  10/29/2022 21 0 - 32 IU/L Final         Passed - Completed PHQ-2 or PHQ-9 in the last 360 days      Passed - Valid encounter within last 6 months    Recent Outpatient Visits           4 weeks ago Dysuria   [facility] Berniece [place], FNP   3 months ago Acute cough   [place] [facility] [place], Marzella Schlein, MD   3 months ago Upper respiratory symptom   [place] [facility] [place], [place], PA-C   4 months ago Encounter for annual physical exam   [facility] [place], Marzella Schlein, MD   5 months ago Dysuria   [facility] [place], Marzella Schlein, MD       Future Appointments             Tomorrow Zane Herald, Rudolpho Sevin, FNP  [facility], [facility]   In 2 weeks Bacigalupo, Marzella Schlein, MD [facility], [facility]

## 2023-03-07 NOTE — Progress Notes (Signed)
BP 124/82 (BP Location: Right Arm, Patient Position: Sitting, Cuff Size: Normal)   Pulse 88   Temp (!) 97.5 F (36.4 C) (Oral)   Resp 12   Ht 5\' 7"  (1.702 m) Comment: per patient  Wt 196 lb 8 oz (89.1 kg)   SpO2 99%   BMI 30.78 kg/m    Subjective:    Patient ID: Erica Scott, female    DOB: [birth year], 22 y.o.   MRN: [id number]  HPI: Erica Scott is a 21 y.o. female  Chief Complaint  Patient presents with   Otalgia    Bilateral/right moreso/x1 week    Discussed the use of AI scribe software for clinical note transcription with the patient, who gave verbal consent to proceed.  History of Present Illness   The patient presents with bilateral ear pain, more severe on the right, that started about a week ago. They describe the right ear as 'popping a lot lately.' They also report some congestion, which they attribute to their known allergies. They deny fever and hearing loss. They are currently on allergy pills and Flonase, which they take every morning. They have a known sensitivity to Keflex but tolerate amoxicillin well.       03/08/2023    3:10 PM 10/29/2022    2:17 PM 09/24/2022    3:05 PM  Depression screen PHQ 2/9  Decreased Interest 0 0 0  Down, Depressed, Hopeless 0 1 1  PHQ - 2 Score 0 1 1  Altered sleeping  0 1  Tired, decreased energy  0 0  Change in appetite  0 0  Feeling bad or failure about yourself   0 0  Trouble concentrating  0 0  Moving slowly or fidgety/restless  0 0  Suicidal thoughts  0 0  PHQ-9 Score  1 2  Difficult doing work/chores  Somewhat difficult Not difficult at all    Relevant past medical, surgical, family and social history reviewed and updated as indicated. Interim medical history since our last visit reviewed. Allergies and medications reviewed and updated.  Review of Systems  Ten systems reviewed and is negative except as mentioned in HPI      Objective:    BP 124/82 (BP Location: Right Arm, Patient Position:  Sitting, Cuff Size: Normal)   Pulse 88   Temp (!) 97.5 F (36.4 C) (Oral)   Resp 12   Ht 5\' 7"  (1.702 m) Comment: per patient  Wt 196 lb 8 oz (89.1 kg)   SpO2 99%   BMI 30.78 kg/m    Wt Readings from Last 3 Encounters:  03/08/23 196 lb 8 oz (89.1 kg)  02/07/23 199 lb 11.2 oz (90.6 kg)  12/04/22 195 lb 4.8 oz (88.6 kg)    Physical Exam  Constitutional: Patient appears well-developed and well-nourished.  No distress.  HEENT: head atraumatic, normocephalic, pupils equal and reactive to light, ears TMs red and bulging, TM intact, neck supple, throat within normal limits Cardiovascular: Normal rate, regular rhythm and normal heart sounds.  No murmur heard. No BLE edema. Pulmonary/Chest: Effort normal and breath sounds normal. No respiratory distress. Abdominal: Soft.  There is no tenderness. Psychiatric: Patient has a normal mood and affect. behavior is normal. Judgment and thought content normal.  Results for orders placed or performed in visit on 02/07/23  POCT urinalysis dipstick   Collection Time: 02/07/23  2:53 PM  Result Value Ref Range   Color, UA yellow    Clarity, UA clear    Glucose, UA  Negative Negative   Bilirubin, UA neg    Ketones, UA neg    Spec Grav, UA 1.025 1.010 - 1.025   Blood, UA neg    pH, UA 6.0 5.0 - 8.0   Protein, UA Negative Negative   Urobilinogen, UA 0.2 0.2 or 1.0 E.U./dL   Nitrite, UA neg    Leukocytes, UA Negative Negative   Appearance clear    Odor normal   Urine Culture   Collection Time: 02/07/23  3:41 PM   Specimen: Urine  Result Value Ref Range   MICRO NUMBER: [id number]    SPECIMEN QUALITY: Adequate    Sample Source URINE    STATUS: FINAL    Result:      Mixed genital flora isolated. These superficial bacteria are not indicative of a urinary tract infection. No further organism identification is warranted on this specimen. If clinically indicated, recollect clean-catch, mid-stream urine and transfer  immediately to Urine Culture  Transport Tube.        Assessment & Plan:   Problem List Items Addressed This Visit   None Visit Diagnoses       Non-recurrent acute suppurative otitis media of both ears without spontaneous rupture of tympanic membranes    -  Primary   Relevant Medications   amoxicillin (AMOXIL) 500 MG capsule        Assessment and Plan    Acute Bilateral Otitis Media Pain in both ears, more severe in the right ear, with associated congestion. Ears appear red on examination. No hearing loss but right ear has been popping. -Prescribe Amoxicillin 500mg  BID for 7 days. -Continue current allergy medications (allergy pills and Flonase). -Patient to send a message in one week to update on condition.        Follow up plan: Return if symptoms worsen or fail to improve.

## 2023-03-08 ENCOUNTER — Ambulatory Visit: Payer: BC Managed Care – PPO | Admitting: Nurse Practitioner

## 2023-03-08 ENCOUNTER — Encounter: Payer: Self-pay | Admitting: Nurse Practitioner

## 2023-03-08 VITALS — BP 124/82 | HR 88 | Temp 97.5°F | Resp 12 | Ht 67.0 in | Wt 196.5 lb

## 2023-03-08 DIAGNOSIS — H66003 Acute suppurative otitis media without spontaneous rupture of ear drum, bilateral: Secondary | ICD-10-CM | POA: Diagnosis not present

## 2023-03-08 MED ORDER — AMOXICILLIN 500 MG PO CAPS: 500.0000 mg | ORAL_CAPSULE | Freq: Two times a day (BID) | ORAL | 0 refills | Status: AC

## 2023-03-14 ENCOUNTER — Encounter: Payer: Self-pay | Admitting: Nurse Practitioner

## 2023-03-15 ENCOUNTER — Encounter: Payer: Self-pay | Admitting: Nurse Practitioner

## 2023-03-15 ENCOUNTER — Ambulatory Visit: Payer: BC Managed Care – PPO | Admitting: Nurse Practitioner

## 2023-03-15 VITALS — BP 122/78 | HR 87 | Temp 98.0°F | Resp 14 | Ht 67.0 in | Wt 196.8 lb

## 2023-03-15 DIAGNOSIS — H6993 Unspecified Eustachian tube disorder, bilateral: Secondary | ICD-10-CM | POA: Diagnosis not present

## 2023-03-15 MED ORDER — METHYLPREDNISOLONE 4 MG PO TBPK: ORAL_TABLET | ORAL | 0 refills | Status: DC

## 2023-03-15 NOTE — Progress Notes (Signed)
BP 122/78 (BP Location: Right Arm, Patient Position: Sitting, Cuff Size: Normal)   Pulse 87   Temp 98 F (36.7 C) (Oral)   Resp 14   Ht 5\' 7"  (1.702 m) Comment: per patient  Wt 196 lb 12.8 oz (89.3 kg)   SpO2 98%   BMI 30.82 kg/m    Subjective:    Patient ID: Erica Scott, female    DOB: [birth year], 22 y.o.   MRN: [id number]  HPI: Erica Scott is a 22 y.o. female  Chief Complaint  Patient presents with   Ear Pain    Bilateral x1+ week    Discussed the use of AI scribe software for clinical note transcription with the patient, who gave verbal consent to proceed.  History of Present Illness   The patient, with a history of otitis media, presents with continued bilateral ear pain despite treatment with amoxicillin. She reports that the ear pain has been ongoing for over a week and that the right ear has been popping frequently. She is currently taking allergy pills and Flonase for suspected eustachian tube dysfunction.       03/15/2023    2:39 PM 03/08/2023    3:10 PM 10/29/2022    2:17 PM  Depression screen PHQ 2/9  Decreased Interest 0 0 0  Down, Depressed, Hopeless 0 0 1  PHQ - 2 Score 0 0 1  Altered sleeping 0  0  Tired, decreased energy 0  0  Change in appetite 0  0  Feeling bad or failure about yourself  0  0  Trouble concentrating 0  0  Moving slowly or fidgety/restless 0  0  Suicidal thoughts 0  0  PHQ-9 Score 0  1  Difficult doing work/chores   Somewhat difficult    Relevant past medical, surgical, family and social history reviewed and updated as indicated. Interim medical history since our last visit reviewed. Allergies and medications reviewed and updated.  Review of Systems  Ten systems reviewed and is negative except as mentioned in HPI      Objective:    BP 122/78 (BP Location: Right Arm, Patient Position: Sitting, Cuff Size: Normal)   Pulse 87   Temp 98 F (36.7 C) (Oral)   Resp 14   Ht 5\' 7"  (1.702 m) Comment: per patient  Wt  196 lb 12.8 oz (89.3 kg)   SpO2 98%   BMI 30.82 kg/m    Wt Readings from Last 3 Encounters:  03/15/23 196 lb 12.8 oz (89.3 kg)  03/08/23 196 lb 8 oz (89.1 kg)  02/07/23 199 lb 11.2 oz (90.6 kg)    Physical Exam  Constitutional: Patient appears well-developed and well-nourished. Obese  No distress.  HEENT: head atraumatic, normocephalic, pupils equal and reactive to light, ears TMs clear, neck supple, throat within normal limits Cardiovascular: Normal rate, regular rhythm and normal heart sounds.  No murmur heard. No BLE edema. Pulmonary/Chest: Effort normal and breath sounds normal. No respiratory distress. Abdominal: Soft.  There is no tenderness. Psychiatric: Patient has a normal mood and affect. behavior is normal. Judgment and thought content normal.  Results for orders placed or performed in visit on 02/07/23  POCT urinalysis dipstick   Collection Time: 02/07/23  2:53 PM  Result Value Ref Range   Color, UA yellow    Clarity, UA clear    Glucose, UA Negative Negative   Bilirubin, UA neg    Ketones, UA neg    Spec Grav, UA 1.025 1.010 - 1.025  Blood, UA neg    pH, UA 6.0 5.0 - 8.0   Protein, UA Negative Negative   Urobilinogen, UA 0.2 0.2 or 1.0 E.U./dL   Nitrite, UA neg    Leukocytes, UA Negative Negative   Appearance clear    Odor normal   Urine Culture   Collection Time: 02/07/23  3:41 PM   Specimen: Urine  Result Value Ref Range   MICRO NUMBER: [id number]    SPECIMEN QUALITY: Adequate    Sample Source URINE    STATUS: FINAL    Result:      Mixed genital flora isolated. These superficial bacteria are not indicative of a urinary tract infection. No further organism identification is warranted on this specimen. If clinically indicated, recollect clean-catch, mid-stream urine and transfer  immediately to Urine Culture Transport Tube.        Assessment & Plan:   Problem List Items Addressed This Visit   None Visit Diagnoses       Dysfunction of both  eustachian tubes    -  Primary   Relevant Medications   methylPREDNISolone (MEDROL DOSEPAK) 4 MG TBPK tablet   Other Relevant Orders   Ambulatory referral to ENT        Assessment and Plan    Otitis Media/eustachian tube dysfunction Treated with Amoxicillin, with resolution of redness in both ears. However, persistent ear pain and popping suggest possible Eustachian tube dysfunction. -Start Medrol Dosepak as directed on the box. -Referral to ENT as a backup plan, but no need to make an appointment if symptoms resolve. -Request patient to send a message updating on her condition.        Follow up plan: Return if symptoms worsen or fail to improve.

## 2023-03-26 ENCOUNTER — Encounter: Payer: Self-pay | Admitting: Family Medicine

## 2023-03-26 ENCOUNTER — Ambulatory Visit: Payer: BC Managed Care – PPO | Admitting: Family Medicine

## 2023-03-26 VITALS — BP 127/69 | HR 95 | Ht 67.0 in | Wt 194.9 lb

## 2023-03-26 DIAGNOSIS — F411 Generalized anxiety disorder: Secondary | ICD-10-CM

## 2023-03-26 MED ORDER — CITALOPRAM HYDROBROMIDE 20 MG PO TABS: 20.0000 mg | ORAL_TABLET | Freq: Every day | ORAL | 3 refills | Status: DC

## 2023-03-26 NOTE — Assessment & Plan Note (Signed)
 Currently on Zoloft  100 mg daily. Reports persistent gastrointestinal side effects, including daily diarrhea and occasional nausea, which began after increasing the dose. Despite these side effects, notes improvement in anxiety symptoms, particularly in reducing ruminating thoughts and depression. Still experiences some anxiety and difficulty sleeping. Discussed potential benefits of switching to Celexa  to achieve similar or better therapeutic effects with fewer side effects. Expressed concerns about the efficacy of a new medication but agreed to the switch after discussing the potential benefits, the lack of need for tapering, and the possibility of trial and error due to genetic variability in response to SSRIs. - Discontinue Zoloft  - Initiate Celexa  20 mg daily - Monitor for gastrointestinal side effects and therapeutic efficacy - Follow-up in six weeks to assess response to Celexa 

## 2023-03-26 NOTE — Progress Notes (Signed)
 Established patient visit   Patient: Erica Scott   DOB: 07/07/2000   23 y.o. Female  MRN: [id number] Visit Date: 03/26/2023  Today's healthcare provider: Jon Eva, MD   Chief Complaint  Patient presents with   Anxiety   Subjective    HPI   Discussed the use of AI scribe software for clinical note transcription with the patient, who gave verbal consent to proceed.  History of Present Illness   The patient, with a history of anxiety and depression, has been on Zoloft  100mg  daily. They report persistent gastrointestinal side effects, including daily diarrhea and occasional nausea, which began after increasing the dose to 100mg . Despite these side effects, the patient notes an improvement in depressive symptoms and a reduction in ruminating thoughts, although they have noticed a recent increase in these thoughts. The patient expresses concern about the potential for a decrease in therapeutic effect if they switch to a different medication.          03/26/2023    3:57 PM 03/15/2023    2:39 PM 03/08/2023    3:10 PM 10/29/2022    2:17 PM 09/24/2022    3:05 PM  Depression screen PHQ 2/9  Decreased Interest 0 0 0 0 0  Down, Depressed, Hopeless 0 0 0 1 1  PHQ - 2 Score 0 0 0 1 1  Altered sleeping 1 0  0 1  Tired, decreased energy 0 0  0 0  Change in appetite 0 0  0 0  Feeling bad or failure about yourself  0 0  0 0  Trouble concentrating 0 0  0 0  Moving slowly or fidgety/restless 0 0  0 0  Suicidal thoughts 0 0  0 0  PHQ-9 Score 1 0  1 2  Difficult doing work/chores    Somewhat difficult Not difficult at all       03/26/2023    3:58 PM 08/09/2022    4:11 PM  GAD 7 : Generalized Anxiety Score  Nervous, Anxious, on Edge 0 3  Control/stop worrying 0 1  Worry too much - different things 1 3  Trouble relaxing 0 1  Restless 0 0  Easily annoyed or irritable 0 0  Afraid - awful might happen 0 2  Total GAD 7 Score 1 10  Anxiety Difficulty Not difficult at all  Not difficult at all      Medications: Outpatient Medications Prior to Visit  Medication Sig   fexofenadine  (ALLEGRA ) 180 MG tablet Take 180 mg by mouth daily.   fluticasone (FLONASE) 50 MCG/ACT nasal spray Place 1 spray into both nostrils 2 (two) times daily.   ipratropium (ATROVENT ) 0.03 % nasal spray Place 2 sprays into both nostrils every 12 (twelve) hours.   montelukast  (SINGULAIR ) 10 MG tablet TAKE 1 TABLET(10 MG) BY MOUTH AT BEDTIME   norethindrone (AYGESTIN) 5 MG tablet    norethindrone (MICRONOR) 0.35 MG tablet Take 1 tablet by mouth daily.   [DISCONTINUED] sertraline  (ZOLOFT ) 100 MG tablet TAKE 1 TABLET(100 MG) BY MOUTH DAILY   methylPREDNISolone  (MEDROL  DOSEPAK) 4 MG TBPK tablet Use as directed. (Patient not taking: Reported on 03/26/2023)   nitrofurantoin , macrocrystal-monohydrate, (MACROBID ) 100 MG capsule Take 1 capsule (100 mg total) by mouth 2 (two) times daily. (Patient not taking: Reported on 03/26/2023)   No facility-administered medications prior to visit.    Review of Systems     Objective    BP 127/69   Pulse 95   Ht 5'  7 (1.702 m)   Wt 194 lb 14.4 oz (88.4 kg)   SpO2 100%   BMI 30.53 kg/m    Physical Exam Vitals reviewed.  Constitutional:      General: She is not in acute distress.    Appearance: Normal appearance. She is well-developed. She is not diaphoretic.  HENT:     Head: Normocephalic and atraumatic.  Eyes:     General: No scleral icterus.    Conjunctiva/sclera: Conjunctivae normal.  Neck:     Thyroid : No thyromegaly.  Cardiovascular:     Rate and Rhythm: Normal rate and regular rhythm.     Heart sounds: Normal heart sounds. No murmur heard. Pulmonary:     Effort: Pulmonary effort is normal. No respiratory distress.     Breath sounds: Normal breath sounds. No wheezing, rhonchi or rales.  Musculoskeletal:     Cervical back: Neck supple.     Right lower leg: No edema.     Left lower leg: No edema.  Lymphadenopathy:     Cervical: No  cervical adenopathy.  Skin:    General: Skin is warm and dry.  Neurological:     Mental Status: She is alert. Mental status is at baseline.  Psychiatric:        Mood and Affect: Mood normal.        Behavior: Behavior normal.      No results found for any visits on 03/26/23.  Assessment & Plan     Problem List Items Addressed This Visit       Other   GAD (generalized anxiety disorder) - Primary   Currently on Zoloft  100 mg daily. Reports persistent gastrointestinal side effects, including daily diarrhea and occasional nausea, which began after increasing the dose. Despite these side effects, notes improvement in anxiety symptoms, particularly in reducing ruminating thoughts and depression. Still experiences some anxiety and difficulty sleeping. Discussed potential benefits of switching to Celexa  to achieve similar or better therapeutic effects with fewer side effects. Expressed concerns about the efficacy of a new medication but agreed to the switch after discussing the potential benefits, the lack of need for tapering, and the possibility of trial and error due to genetic variability in response to SSRIs. - Discontinue Zoloft  - Initiate Celexa  20 mg daily - Monitor for gastrointestinal side effects and therapeutic efficacy - Follow-up in six weeks to assess response to Celexa       Relevant Medications   citalopram  (CELEXA ) 20 MG tablet       General Health Maintenance Confirmed absence of diabetes despite incorrect documentation. Advised to decline unnecessary diabetes screenings. - Remove diabetes diagnosis from the chart        Return in about 6 weeks (around 05/07/2023) for MDD/GAD f/u, virtual ok.       Jon Eva, MD  [facility] [phone] (phone) [phone] (fax)  [facility]

## 2023-03-28 DIAGNOSIS — J301 Allergic rhinitis due to pollen: Secondary | ICD-10-CM | POA: Diagnosis not present

## 2023-04-01 MED ORDER — FLUOXETINE HCL 20 MG PO CAPS: 20.0000 mg | ORAL_CAPSULE | Freq: Every day | ORAL | 3 refills | Status: DC

## 2023-04-04 DIAGNOSIS — J301 Allergic rhinitis due to pollen: Secondary | ICD-10-CM | POA: Diagnosis not present

## 2023-04-11 DIAGNOSIS — J301 Allergic rhinitis due to pollen: Secondary | ICD-10-CM | POA: Diagnosis not present

## 2023-04-21 ENCOUNTER — Other Ambulatory Visit: Payer: Self-pay | Admitting: Family Medicine

## 2023-04-23 NOTE — Telephone Encounter (Signed)
 Requested Prescriptions  Pending Prescriptions Disp Refills   montelukast  (SINGULAIR ) 10 MG tablet [Pharmacy Med Name: MONTELUKAST  10MG  TABLETS] 90 tablet 0    Sig: TAKE 1 TABLET(10 MG) BY MOUTH AT BEDTIME     Pulmonology:  Leukotriene Inhibitors Passed - 04/23/2023  9:58 AM      Passed - Valid encounter within last 12 months    Recent Outpatient Visits           4 weeks ago GAD (generalized anxiety disorder)   [place] [facility] [place], Jon HERO, MD   1 month ago Dysfunction of both eustachian tubes   [facility] Gareth Clarity F, FNP   1 month ago Non-recurrent acute suppurative otitis media of both ears without spontaneous rupture of tympanic membranes   [facility] Gareth Clarity FALCON, FNP   2 months ago Dysuria   [facility] Gareth Clarity FALCON, FNP   4 months ago Acute cough   [place] [facility] [place], Jon HERO, MD       Future Appointments             In 2 weeks Bacigalupo, Jon HERO, MD [facility]

## 2023-04-25 DIAGNOSIS — J301 Allergic rhinitis due to pollen: Secondary | ICD-10-CM | POA: Diagnosis not present

## 2023-05-01 DIAGNOSIS — J301 Allergic rhinitis due to pollen: Secondary | ICD-10-CM | POA: Diagnosis not present

## 2023-05-07 ENCOUNTER — Ambulatory Visit: Payer: Self-pay | Admitting: Family Medicine

## 2023-05-17 ENCOUNTER — Ambulatory Visit: Payer: BC Managed Care – PPO | Admitting: Family Medicine

## 2023-05-20 ENCOUNTER — Ambulatory Visit: Payer: BC Managed Care – PPO | Admitting: Family Medicine

## 2023-05-20 VITALS — BP 119/66 | HR 84 | Ht 67.0 in | Wt 199.7 lb

## 2023-05-20 DIAGNOSIS — F411 Generalized anxiety disorder: Secondary | ICD-10-CM

## 2023-05-20 DIAGNOSIS — R3989 Other symptoms and signs involving the genitourinary system: Secondary | ICD-10-CM

## 2023-05-20 DIAGNOSIS — R208 Other disturbances of skin sensation: Secondary | ICD-10-CM | POA: Diagnosis not present

## 2023-05-20 DIAGNOSIS — R350 Frequency of micturition: Secondary | ICD-10-CM | POA: Diagnosis not present

## 2023-05-20 LAB — POCT URINALYSIS DIPSTICK
Bilirubin, UA: NEGATIVE
Blood, UA: NEGATIVE
Glucose, UA: NEGATIVE
Ketones, UA: NEGATIVE
Leukocytes, UA: NEGATIVE
Nitrite, UA: NEGATIVE
Protein, UA: NEGATIVE
Spec Grav, UA: 1.01 (ref 1.010–1.025)
Urobilinogen, UA: 0.2 U/dL
pH, UA: 7 (ref 5.0–8.0)

## 2023-05-20 MED ORDER — SERTRALINE HCL 50 MG PO TABS: 125.0000 mg | ORAL_TABLET | Freq: Every day | ORAL | 2 refills | Status: DC

## 2023-05-20 NOTE — Progress Notes (Unsigned)
 Acute visit   Patient: Erica Scott   DOB: Aug 07, 2000   22 y.o. Female  MRN: [id number] PCP: Erasmo Downer, MD   Chief Complaint  Patient presents with   Urinary Tract Infection    Pt accompanied by mother at visit.  She reports new onset urinary frequency and burning snesation while urinating. The current episode started about a week ago and is gradually improving. Patient states symptoms are 4/10 in intensity, occurring every day. She  has not been recently treated for similar symptoms.    Associated symptoms:No abdominal pain No back pain No chillsNo constipation No cramping No diarrhea No discharge No fever No hematuria No nausea No vomiting    Anxiety    Pt would like to discuss medication. Pt reports she was given rx for alternate medications but wanting to know if she can stay with the one she is currently taking and up the dose and her symptoms are getting better and she has not made the medication change ye.    Care Management    Cervical Cancer Screening - Pt reports having GYN and will schedule apt   Subjective    Discussed the use of AI scribe software for clinical note transcription with the patient, who gave verbal consent to proceed.  History of Present Illness   The patient, with a history of anxiety, presents with concerns about urinary symptoms and anxiety management. She reports increased urinary frequency and burning during ovulation and menstruation, which have improved over the weekend. The patient describes these symptoms as a recurrent issue, noting that she has always had bladder irritation. Despite these symptoms, a urinary dipstick test showed no signs of infection.  In terms of her anxiety, the patient is currently on Zoloft, which has improved her sadness and anxiety. However, she still struggles with relaxing at night and expresses a desire for a slight increase in her medication dosage. The patient also mentions initial gastrointestinal  side effects from Zoloft, which have improved over time.          03/26/2023    3:57 PM 03/15/2023    2:39 PM 03/08/2023    3:10 PM 10/29/2022    2:17 PM 09/24/2022    3:05 PM  Depression screen PHQ 2/9  Decreased Interest 0 0 0 0 0  Down, Depressed, Hopeless 0 0 0 1 1  PHQ - 2 Score 0 0 0 1 1  Altered sleeping 1 0  0 1  Tired, decreased energy 0 0  0 0  Change in appetite 0 0  0 0  Feeling bad or failure about yourself  0 0  0 0  Trouble concentrating 0 0  0 0  Moving slowly or fidgety/restless 0 0  0 0  Suicidal thoughts 0 0  0 0  PHQ-9 Score 1 0  1 2  Difficult doing work/chores    Somewhat difficult Not difficult at all      03/26/2023    3:58 PM 08/09/2022    4:11 PM  GAD 7 : Generalized Anxiety Score  Nervous, Anxious, on Edge 0 3  Control/stop worrying 0 1  Worry too much - different things 1 3  Trouble relaxing 0 1  Restless 0 0  Easily annoyed or irritable 0 0  Afraid - awful might happen 0 2  Total GAD 7 Score 1 10  Anxiety Difficulty Not difficult at all Not difficult at all     Review of Systems  Objective  BP 119/66 (BP Location: Left Arm, Patient Position: Sitting, Cuff Size: Normal)   Pulse 84   Ht 5\' 7"  (1.702 m)   Wt 199 lb 11.2 oz (90.6 kg)   LMP 05/05/2023   SpO2 100%   BMI 31.28 kg/m  Physical Exam Vitals reviewed.  Constitutional:      General: She is not in acute distress.    Appearance: Normal appearance. She is well-developed. She is not diaphoretic.  HENT:     Head: Normocephalic and atraumatic.  Eyes:     General: No scleral icterus.    Conjunctiva/sclera: Conjunctivae normal.  Neck:     Thyroid: No thyromegaly.  Cardiovascular:     Rate and Rhythm: Normal rate and regular rhythm.     Pulses: Normal pulses.     Heart sounds: Normal heart sounds. No murmur heard. Pulmonary:     Effort: Pulmonary effort is normal. No respiratory distress.     Breath sounds: Normal breath sounds. No wheezing, rhonchi or rales.  Abdominal:      General: There is no distension.     Palpations: Abdomen is soft.     Tenderness: There is no abdominal tenderness.  Musculoskeletal:     Cervical back: Neck supple.     Right lower leg: No edema.     Left lower leg: No edema.  Lymphadenopathy:     Cervical: No cervical adenopathy.  Skin:    General: Skin is warm and dry.     Findings: No rash.  Neurological:     Mental Status: She is alert and oriented to person, place, and time. Mental status is at baseline.  Psychiatric:        Mood and Affect: Mood is anxious.        Behavior: Behavior normal.       Results for orders placed or performed in visit on 05/20/23  POCT Urinalysis Dipstick  Result Value Ref Range   Color, UA     Clarity, UA     Glucose, UA Negative Negative   Bilirubin, UA negative    Ketones, UA negative    Spec Grav, UA 1.010 1.010 - 1.025   Blood, UA negative    pH, UA 7.0 5.0 - 8.0   Protein, UA Negative Negative   Urobilinogen, UA 0.2 0.2 or 1.0 E.U./dL   Nitrite, UA negative    Leukocytes, UA Negative Negative   Appearance     Odor      Assessment & Plan     Problem List Items Addressed This Visit       Other   GAD (generalized anxiety disorder)   Has not switched from sertraline to fluoxetine due to improvement in gastrointestinal side effects. Currently on 100 mg sertraline daily, with some residual anxiety and difficulty relaxing at night. Discussed increasing sertraline to 125 mg daily using 50 mg pills to avoid a large jump to 150 mg. Prefers to try 125 mg first. - Prescribe sertraline 50 mg pills, instruct to take 2.5 pills daily to achieve 125 mg dose. - Provide a 30-day supply to assess tolerance and effectiveness. - Reassess in two months.      Relevant Medications   sertraline (ZOLOFT) 50 MG tablet   Other Visit Diagnoses       Suspected UTI    -  Primary   Relevant Orders   POCT Urinalysis Dipstick (Completed)     Urinary frequency       Relevant Orders   POCT Urinalysis  Dipstick (Completed)     Burning sensation       Relevant Orders   POCT Urinalysis Dipstick (Completed)       Assessment and Plan    Urinary Tract Infection (UTI) Symptoms Reports urinary frequency, burning, and pain during ovulation last week, which have since improved. Urinalysis dipstick was normal, showing no leukocytes, nitrites, or hematuria, indicating no bacterial infection. Symptom improvement suggests bladder irritation rather than infection. Sending a culture is unnecessary given symptom improvement and normal dipstick results.         Meds ordered this encounter  Medications   sertraline (ZOLOFT) 50 MG tablet    Sig: Take 2.5 tablets (125 mg total) by mouth daily.    Dispense:  75 tablet    Refill:  2     Return in about 2 months (around 07/20/2023) for chronic disease f/u.      Shirlee Latch, MD  [facility] [phone] (phone) [phone] (fax)  [facility]

## 2023-05-21 ENCOUNTER — Encounter: Payer: Self-pay | Admitting: Family Medicine

## 2023-05-21 ENCOUNTER — Telehealth: Payer: Self-pay

## 2023-05-21 NOTE — Assessment & Plan Note (Signed)
 Has not switched from sertraline to fluoxetine due to improvement in gastrointestinal side effects. Currently on 100 mg sertraline daily, with some residual anxiety and difficulty relaxing at night. Discussed increasing sertraline to 125 mg daily using 50 mg pills to avoid a large jump to 150 mg. Prefers to try 125 mg first. - Prescribe sertraline 50 mg pills, instruct to take 2.5 pills daily to achieve 125 mg dose. - Provide a 30-day supply to assess tolerance and effectiveness. - Reassess in two months.

## 2023-05-21 NOTE — Telephone Encounter (Signed)
 Pt advised rx was sent yesterday for zoloft and is still showing on medication list. Advised to speak with pharmacy to see if new rx needs to be sent or if something needs to be corrected on our end. She verbalized understanding

## 2023-05-21 NOTE — Telephone Encounter (Signed)
 Copied from CRM [id number]. Topic: Clinical - Prescription Issue >> May 21, 2023 11:14 AM Erica Scott T wrote: Reason for CRM: patient called stated was showing on her app yesterday and now its not available and would like someone to f/u with pharmacy. Advised patient to call the pharmacy also

## 2023-05-22 ENCOUNTER — Ambulatory Visit: Payer: BC Managed Care – PPO | Admitting: Nurse Practitioner

## 2023-05-22 NOTE — Telephone Encounter (Signed)
 Patient spoke to pharmacy and advised they are unable to fill it, insurance will not cover the zoloft rx due to the way it is written. Insurance will cover 1.5 tablets.   Please assist patient further

## 2023-05-23 ENCOUNTER — Other Ambulatory Visit: Payer: Self-pay

## 2023-05-23 ENCOUNTER — Telehealth: Payer: Self-pay | Admitting: Family Medicine

## 2023-05-23 ENCOUNTER — Ambulatory Visit: Payer: BC Managed Care – PPO | Admitting: Family Medicine

## 2023-05-23 ENCOUNTER — Other Ambulatory Visit (HOSPITAL_COMMUNITY): Payer: Self-pay

## 2023-05-23 DIAGNOSIS — J301 Allergic rhinitis due to pollen: Secondary | ICD-10-CM | POA: Diagnosis not present

## 2023-05-23 MED ORDER — SERTRALINE HCL 100 MG PO TABS: 150.0000 mg | ORAL_TABLET | Freq: Every day | ORAL | 1 refills | Status: DC

## 2023-05-23 NOTE — Telephone Encounter (Signed)
 Closing this duplicate encounter, please refer to 05/21/23 Telephone Encounter that is active with note as of 05/23/23 12:22 PM Note I advised her that she would need to go from 100 to 150 dose.  She was adamant about 125 (which is not a usual dose). Please send the 100 mg pills with instructions to take 1.5 daily (150mg ) and let patient know it is not possible to do 125.     Copied from CRM [id number]. Topic: Clinical - Medical Advice >> May 23, 2023  2:09 PM Yolanda T wrote: Reason for CRM: patient called stated she has left several messages in mychart letting the provider know the script for sertraline (ZOLOFT) 50 MG tablet is not covered by her insurance because is the way the script is written (Take 2.5 tablets (125 mg total) by mouth daily) Insurance will only cover it if its written for 1.5 tablets. Patient is requesting a new script for pick up

## 2023-05-23 NOTE — Telephone Encounter (Signed)
 I advised her that she would need to go from 100 to 150 dose.  She was adamant about 125 (which is not a usual dose). Please send the 100 mg pills with instructions to take 1.5 daily (150mg ) and let patient know it is not possible to do 125.

## 2023-05-28 ENCOUNTER — Encounter: Payer: Self-pay | Admitting: Family Medicine

## 2023-06-18 DIAGNOSIS — J301 Allergic rhinitis due to pollen: Secondary | ICD-10-CM | POA: Diagnosis not present

## 2023-06-21 ENCOUNTER — Encounter: Payer: Self-pay | Admitting: Family Medicine

## 2023-06-24 ENCOUNTER — Telehealth: Payer: Self-pay | Admitting: Family Medicine

## 2023-06-24 NOTE — Telephone Encounter (Signed)
 Called back earlier to reach out to patient  mother and got no answer left her a voicemail to get this set up because Dr. B did approve so trying to get her scheduled

## 2023-06-25 DIAGNOSIS — J301 Allergic rhinitis due to pollen: Secondary | ICD-10-CM | POA: Diagnosis not present

## 2023-06-30 ENCOUNTER — Encounter: Payer: Self-pay | Admitting: Family Medicine

## 2023-07-01 ENCOUNTER — Telehealth: Payer: Self-pay | Admitting: Family Medicine

## 2023-07-01 MED ORDER — FEXOFENADINE HCL 180 MG PO TABS: 180.0000 mg | ORAL_TABLET | Freq: Every day | ORAL | 3 refills | Status: AC

## 2023-07-01 NOTE — Telephone Encounter (Signed)
 Patient has an appt scheduled with an NP eleswhere for this Friday 4/18 since we are full for the week

## 2023-07-04 ENCOUNTER — Encounter: Payer: Self-pay | Admitting: Nurse Practitioner

## 2023-07-08 ENCOUNTER — Telehealth: Admitting: Nurse Practitioner

## 2023-07-08 ENCOUNTER — Ambulatory Visit: Admitting: Family Medicine

## 2023-07-08 ENCOUNTER — Encounter: Payer: Self-pay | Admitting: Family Medicine

## 2023-07-08 VITALS — BP 124/62 | HR 98 | Ht 67.0 in | Wt 196.0 lb

## 2023-07-08 DIAGNOSIS — I951 Orthostatic hypotension: Secondary | ICD-10-CM | POA: Diagnosis not present

## 2023-07-08 DIAGNOSIS — H66001 Acute suppurative otitis media without spontaneous rupture of ear drum, right ear: Secondary | ICD-10-CM | POA: Diagnosis not present

## 2023-07-08 MED ORDER — AMOXICILLIN-POT CLAVULANATE 875-125 MG PO TABS: 1.0000 | ORAL_TABLET | Freq: Two times a day (BID) | ORAL | 0 refills | Status: AC

## 2023-07-08 NOTE — Progress Notes (Signed)
 Acute visit   Patient: Erica Scott   DOB: 09/05/2000   22 y.o. Female  MRN: [id number] PCP: Mazie Speed, MD   Chief Complaint  Patient presents with   Ear Pain    R ear pain for a couple days,    Sinusitis    Sinus pressure but her ear hurts worst    Subjective    Discussed the use of AI scribe software for clinical note transcription with the patient, who gave verbal consent to proceed.  History of Present Illness   The patient, with a history of allergies, presents with right ear pain and sinus congestion. The ear pain has been ongoing for four to five days and has worsened over the last two to three days. The sinus congestion has been present for approximately a week. She denies cough, fever, and chills. She has been using nasal sprays and Sudafed, which helps with the congestion but less so with the ear pain.  In addition, the patient reports episodes of feeling woozy, especially when changing positions, such as standing up or lifting her head. This has been occurring frequently over the last couple of months and lasts for about five to ten seconds each time. She describes it as a feeling of vertigo without the spinning.        Review of Systems  Objective    BP 124/62   Pulse 98   Ht 5\' 7"  (1.702 m)   Wt 196 lb (88.9 kg)   SpO2 100%   BMI 30.70 kg/m  Physical Exam Vitals reviewed.  Constitutional:      General: She is not in acute distress.    Appearance: Normal appearance. She is well-developed. She is not diaphoretic.  HENT:     Head: Normocephalic and atraumatic.     Right Ear: Ear canal and external ear normal. Tympanic membrane is erythematous and bulging.     Left Ear: Tympanic membrane, ear canal and external ear normal.     Nose: Nose normal.     Mouth/Throat:     Mouth: Mucous membranes are moist.     Pharynx: Oropharynx is clear. No oropharyngeal exudate.  Eyes:     General: No scleral icterus.    Conjunctiva/sclera: Conjunctivae  normal.     Pupils: Pupils are equal, round, and reactive to light.  Cardiovascular:     Rate and Rhythm: Normal rate and regular rhythm.     Heart sounds: Normal heart sounds. No murmur heard. Pulmonary:     Effort: Pulmonary effort is normal. No respiratory distress.     Breath sounds: Normal breath sounds. No wheezing or rales.  Musculoskeletal:     Cervical back: Neck supple.     Right lower leg: No edema.     Left lower leg: No edema.  Lymphadenopathy:     Cervical: No cervical adenopathy.  Skin:    General: Skin is warm and dry.     Findings: No rash.  Neurological:     Mental Status: She is alert.       No results found for any visits on 07/08/23.  Assessment & Plan     Problem List Items Addressed This Visit   None Visit Diagnoses       Non-recurrent acute suppurative otitis media of right ear without spontaneous rupture of tympanic membrane    -  Primary   Relevant Medications   amoxicillin -clavulanate (AUGMENTIN ) 875-125 MG tablet     Orthostasis  Acute right ear infection Acute right ear infection with significant erythema of the tympanic membrane, worsening over the last 2-3 days. No fever or chills. Sinus symptoms present for about a week, but otalgia is more severe. No allergies to penicillins, but allergic to Keflex. Augmentin  is chosen as the first-line treatment due to its effectiveness in treating both otitis media and sinusitis. Treatment aims to resolve the infection and prevent tympanic membrane rupture. - Prescribe Augmentin  twice daily for seven days. - Advise to start Augmentin  immediately, even if the last day will have only one dose. - Instruct to notify if symptoms do not improve. - Recommend acetaminophen  and ibuprofen  for analgesia as needed.  orthostatic symptoms Orthostatic symptoms with presyncope upon standing or sitting up, worsening over the last couple of months. Symptoms include a sensation of syncope without vertigo.  Likely due to naturally low blood pressure, common in young women. Advised to increase sodium intake to support blood pressure. - Advise to maintain adequate hydration and increase sodium intake. - Recommend using a salt shaker or electrolyte packets like Liquid IV to increase sodium intake. - not iatrogenic       Meds ordered this encounter  Medications   amoxicillin -clavulanate (AUGMENTIN ) 875-125 MG tablet    Sig: Take 1 tablet by mouth 2 (two) times daily for 7 days.    Dispense:  14 tablet    Refill:  0     Return if symptoms worsen or fail to improve.      Aden Agreste, MD  [facility] [phone] (phone) [phone] (fax)  [facility]

## 2023-07-11 ENCOUNTER — Encounter: Payer: Self-pay | Admitting: Family Medicine

## 2023-07-11 ENCOUNTER — Ambulatory Visit: Admitting: Nurse Practitioner

## 2023-07-11 MED ORDER — AZITHROMYCIN 250 MG PO TABS: ORAL_TABLET | ORAL | 0 refills | Status: DC

## 2023-07-11 NOTE — Telephone Encounter (Signed)
 The augmentin  and doxycycline  are the 2 best treatments other than cephalosporins, which she is allergic to.  Can try Azithromycin  500mg  PO daily x1 d and then 250mg  daily x4 days #6r0, but may be less effective

## 2023-07-11 NOTE — Telephone Encounter (Signed)
 Ok to have her switch to doxycycline  100mg  BID x7d please send Rx

## 2023-07-12 ENCOUNTER — Ambulatory Visit: Admitting: Nurse Practitioner

## 2023-07-22 ENCOUNTER — Ambulatory Visit: Admitting: Family Medicine

## 2023-07-25 ENCOUNTER — Ambulatory Visit: Admitting: Family Medicine

## 2023-07-25 ENCOUNTER — Encounter: Payer: Self-pay | Admitting: Family Medicine

## 2023-07-25 VITALS — BP 114/77 | HR 74 | Ht 67.0 in | Wt 193.4 lb

## 2023-07-25 DIAGNOSIS — F411 Generalized anxiety disorder: Secondary | ICD-10-CM | POA: Diagnosis not present

## 2023-07-25 MED ORDER — MONTELUKAST SODIUM 10 MG PO TABS: 10.0000 mg | ORAL_TABLET | Freq: Every day | ORAL | 3 refills | Status: AC

## 2023-07-25 MED ORDER — SERTRALINE HCL 100 MG PO TABS: 150.0000 mg | ORAL_TABLET | Freq: Every day | ORAL | 1 refills | Status: DC

## 2023-07-25 NOTE — Progress Notes (Signed)
 Established patient visit   Patient: Erica Scott   DOB: 10-26-2000   22 y.o. Female  MRN: [id number] Visit Date: 07/25/2023  Today's healthcare provider: Aden Agreste, MD   Chief Complaint  Patient presents with   Medical Management of Chronic Issues   Anxiety    She reports excellent compliance with treatment. She reports excellent tolerance of treatment. She is having side effects. (Loose stools) She feels her anxiety is mild and Improved since last visit.    Subjective    Anxiety     HPI     Anxiety    Additional comments: She reports excellent compliance with treatment. She reports excellent tolerance of treatment. She is having side effects. (Loose stools) She feels her anxiety is mild and Improved since last visit.       Last edited by Pasty Bongo, CMA on 07/25/2023  3:48 PM.       Discussed the use of AI scribe software for clinical note transcription with the patient, who gave verbal consent to proceed.  History of Present Illness   Erica Scott is a 23 year old female who presents for a follow-up on anxiety management with Zoloft .  She is on Zoloft  150 mg for anxiety, which is more effective than the previous 100 mg dose. She experiences diarrhea once daily, which worsens with stress. This side effect was present with the 100 mg dose but normalized over time. She uses Imodium as needed but prefers not to take it regularly. No other side effects are noted.  She completed azithromycin  for ear pain, which initially improved but has since returned in the right ear. She uses a nasal spray inconsistently, which may affect ear pressure and pain.  She requests a refill for montelukast  and continues to use [facility] for prescriptions.          07/08/2023    4:14 PM 03/26/2023    3:58 PM 08/09/2022    4:11 PM  GAD 7 : Generalized Anxiety Score  Nervous, Anxious, on Edge 0 0 3  Control/stop worrying 0 0 1  Worry too much - different things 0  1 3  Trouble relaxing 0 0 1  Restless 0 0 0  Easily annoyed or irritable 0 0 0  Afraid - awful might happen 0 0 2  Total GAD 7 Score 0 1 10  Anxiety Difficulty Not difficult at all Not difficult at all Not difficult at all        07/08/2023    4:14 PM 03/26/2023    3:57 PM 03/15/2023    2:39 PM 03/08/2023    3:10 PM 10/29/2022    2:17 PM  Depression screen PHQ 2/9  Decreased Interest 0 0 0 0 0  Down, Depressed, Hopeless 0 0 0 0 1  PHQ - 2 Score 0 0 0 0 1  Altered sleeping  1 0  0  Tired, decreased energy  0 0  0  Change in appetite  0 0  0  Feeling bad or failure about yourself   0 0  0  Trouble concentrating  0 0  0  Moving slowly or fidgety/restless  0 0  0  Suicidal thoughts  0 0  0  PHQ-9 Score  1 0  1  Difficult doing work/chores     Somewhat difficult     Medications: Outpatient Medications Prior to Visit  Medication Sig   [DISCONTINUED] sertraline  (ZOLOFT ) 100 MG tablet Take 1.5 tablets (150 mg total)  by mouth daily.   fexofenadine  (ALLEGRA ) 180 MG tablet Take 1 tablet (180 mg total) by mouth daily.   fluticasone (FLONASE) 50 MCG/ACT nasal spray Place 1 spray into both nostrils 2 (two) times daily.   [DISCONTINUED] azithromycin  (ZITHROMAX ) 250 MG tablet Take 500 mg PO daily x1 day and then 250mg  daily for 4 days. (Patient not taking: Reported on 07/25/2023)   [DISCONTINUED] ipratropium (ATROVENT ) 0.03 % nasal spray Place 2 sprays into both nostrils every 12 (twelve) hours. (Patient not taking: Reported on 07/25/2023)   [DISCONTINUED] montelukast  (SINGULAIR ) 10 MG tablet TAKE 1 TABLET(10 MG) BY MOUTH AT BEDTIME   [DISCONTINUED] norethindrone (AYGESTIN) 5 MG tablet  (Patient not taking: Reported on 07/25/2023)   [DISCONTINUED] norethindrone (MICRONOR) 0.35 MG tablet Take 1 tablet by mouth daily. (Patient not taking: Reported on 07/25/2023)   [DISCONTINUED] SLYND 4 MG TABS Take 1 tablet by mouth daily. (Patient not taking: Reported on 07/25/2023)   No facility-administered  medications prior to visit.    Review of Systems     Objective    BP 114/77 (BP Location: Left Arm, Patient Position: Sitting, Cuff Size: Large)   Pulse 74   Ht 5\' 7"  (1.702 m)   Wt 193 lb 6.4 oz (87.7 kg)   BMI 30.29 kg/m    Physical Exam Vitals reviewed.  Constitutional:      General: She is not in acute distress.    Appearance: She is well-developed.  HENT:     Head: Normocephalic and atraumatic.     Right Ear: Tympanic membrane, ear canal and external ear normal.     Left Ear: Tympanic membrane, ear canal and external ear normal.  Eyes:     General: No scleral icterus.    Conjunctiva/sclera: Conjunctivae normal.  Cardiovascular:     Rate and Rhythm: Normal rate and regular rhythm.  Pulmonary:     Effort: Pulmonary effort is normal. No respiratory distress.  Skin:    General: Skin is warm and dry.     Findings: No rash.  Neurological:     Mental Status: She is alert and oriented to person, place, and time.  Psychiatric:        Mood and Affect: Mood normal.        Behavior: Behavior normal.      No results found for any visits on 07/25/23.  Assessment & Plan     Problem List Items Addressed This Visit       Other   GAD (generalized anxiety disorder) - Primary   Generalized anxiety disorder is managed with Zoloft , currently at 150 mg. She reports improved anxiety symptoms compared to the previous 100 mg dose. However, she experiences persistent diarrhea, a known side effect due to serotonin receptors in the gut. She is willing to continue the current dose for another 1-2 months to see if the side effect resolves, as it did with the 100 mg dose. - Continue Zoloft  150 mg. - Consider alternative medication if diarrhea persists beyond 1-2 months. - Use Imodium as needed for diarrhea.      Relevant Medications   sertraline  (ZOLOFT ) 100 MG tablet       Diarrhea due to Zoloft  Diarrhea is a side effect of Zoloft , likely due to serotonin receptors in the gut.  She reports diarrhea once daily, exacerbated by stress, persisting for two months since increasing the Zoloft  dose to 150 mg. Imodium helps, but she prefers not to use it frequently. - Use Imodium as needed. - Consider alternative  medications if diarrhea persists beyond 1-2 months.  Ear pain She reports right ear pain, previously treated with azithromycin . Examination shows no signs of infection, suggesting the pain may be due to backup pressure from allergies rather than infection. - Use nasal spray consistently to alleviate backup pressure and prevent ear pain. - No antibiotic treatment necessary as there is no infection.       Return in about 3 months (around 10/25/2023) for CPE.       Aden Agreste, MD  [facility] [phone] (phone) [phone] (fax)  [facility]

## 2023-07-25 NOTE — Assessment & Plan Note (Signed)
 Generalized anxiety disorder is managed with Zoloft , currently at 150 mg. She reports improved anxiety symptoms compared to the previous 100 mg dose. However, she experiences persistent diarrhea, a known side effect due to serotonin receptors in the gut. She is willing to continue the current dose for another 1-2 months to see if the side effect resolves, as it did with the 100 mg dose. - Continue Zoloft  150 mg. - Consider alternative medication if diarrhea persists beyond 1-2 months. - Use Imodium as needed for diarrhea.

## 2023-07-30 ENCOUNTER — Encounter: Payer: Self-pay | Admitting: Family Medicine

## 2023-08-02 ENCOUNTER — Other Ambulatory Visit: Payer: Self-pay

## 2023-08-02 MED ORDER — FLUOXETINE HCL 20 MG PO CAPS: 20.0000 mg | ORAL_CAPSULE | Freq: Every day | ORAL | 3 refills | Status: DC

## 2023-08-02 NOTE — Telephone Encounter (Signed)
 Ok to switch to PRozac  20mg  daily #30 r3. We will follow up on it in August as scheduled. Stop Zoloft  and remove from med list.

## 2023-08-05 ENCOUNTER — Ambulatory Visit: Admitting: Family Medicine

## 2023-08-21 ENCOUNTER — Encounter: Payer: Self-pay | Admitting: Family Medicine

## 2023-08-21 NOTE — Telephone Encounter (Signed)
 Covering Provider: please see the pt question below and advise

## 2023-08-26 ENCOUNTER — Ambulatory Visit
Admission: RE | Admit: 2023-08-26 | Discharge: 2023-08-26 | Disposition: A | Discharge status: Home / Self Care | Source: Ambulatory Visit | Attending: Emergency Medicine | Admitting: Emergency Medicine

## 2023-08-26 VITALS — BP 116/76 | HR 75 | Temp 98.0°F | Resp 18

## 2023-08-26 DIAGNOSIS — R300 Dysuria: Secondary | ICD-10-CM | POA: Insufficient documentation

## 2023-08-26 LAB — POCT URINALYSIS DIP (MANUAL ENTRY)
Bilirubin, UA: NEGATIVE
Blood, UA: NEGATIVE
Glucose, UA: NEGATIVE mg/dL
Leukocytes, UA: NEGATIVE
Nitrite, UA: POSITIVE — AB
Spec Grav, UA: 1.02
Urobilinogen, UA: 0.2 U/dL
pH, UA: 7

## 2023-08-26 LAB — POCT URINE PREGNANCY: Preg Test, Ur: NEGATIVE

## 2023-08-26 MED ORDER — NITROFURANTOIN MONOHYD MACRO 100 MG PO CAPS: 100.0000 mg | ORAL_CAPSULE | Freq: Two times a day (BID) | ORAL | 0 refills | Status: DC

## 2023-08-26 NOTE — Discharge Instructions (Signed)
 Take the antibiotic as directed.  The urine culture is pending.  We will call you if it shows the need to change or discontinue your antibiotic.    Follow up with your primary care provider if your symptoms are not improving.

## 2023-08-26 NOTE — ED Triage Notes (Signed)
 Patient to Urgent Care with complaints of urinary urgency and frequency/ back pain/ suprapubic pressure. Denies any known fevers.  Symptoms started 3-4 days ago.  Taking azo (last dose yesterday).

## 2023-08-26 NOTE — ED Provider Notes (Signed)
 Erica Scott    CSN: [id number] Arrival date & time: 08/26/23  1644      History   Chief Complaint Chief Complaint  Patient presents with   Urinary Frequency    urinary urgency, back/flank pain, pelvic discomfort - Entered by patient    HPI Erica Scott is a 23 y.o. female.  Patient presents with 3-day history of dysuria, urinary frequency, urinary urgency, bladder pressure, low back pain.  No fever, hematuria, vomiting, diarrhea, constipation, vaginal discharge, pelvic pain.  Last bowel movement yesterday.  She took Azo yesterday afternoon.  The history is provided by the patient and medical records.    Past Medical History:  Diagnosis Date   Allergy    Anxiety    Chronic tonsillitis     Patient Active Problem List   Diagnosis Date Noted   Recurrent sinusitis 10/29/2022   GAD (generalized anxiety disorder) 08/09/2022   Mass overlapping multiple quadrants of left breast 02/19/2022   Menstrual pain 02/19/2022   Seasonal allergies 02/19/2022   Allergic rhinitis 10/25/2020   History of sore throat 10/25/2020   Status post tonsillectomy 10/25/2020   Heart palpitations 12/17/2018   Cyst of right ovary 10/16/2018   Irregular bleeding 10/16/2018   Pelvic pain 10/16/2018    Past Surgical History:  Procedure Laterality Date   TONSILLECTOMY AND ADENOIDECTOMY N/A 10/25/2017   Procedure: TONSILLECTOMY AND ADENOIDECTOMY;  Surgeon: Lesly Raspberry, MD;  Location: [facility];  Service: ENT;  Laterality: N/A;   WISDOM TOOTH EXTRACTION      OB History     Gravida  0   Para  0   Term  0   Preterm  0   AB  0   Living  0      SAB  0   IAB  0   Ectopic  0   Multiple  0   Live Births  0            Home Medications    Prior to Admission medications   Medication Sig Start Date End Date Taking? Authorizing Provider  FLUoxetine  (PROZAC ) 20 MG capsule Take 1 capsule (20 mg total) by mouth daily. Patient not taking: Reported on  08/26/2023 08/02/23   Bacigalupo, Angela M, MD  nitrofurantoin , macrocrystal-monohydrate, (MACROBID ) 100 MG capsule Take 1 capsule (100 mg total) by mouth 2 (two) times daily. 08/26/23  Yes Wellington Half, NP  sertraline  (ZOLOFT ) 100 MG tablet Take by mouth. 08/16/23  Yes [provider]  fexofenadine  (ALLEGRA ) 180 MG tablet Take 1 tablet (180 mg total) by mouth daily. 07/01/23   Bacigalupo, Angela M, MD  fluticasone (FLONASE) 50 MCG/ACT nasal spray Place 1 spray into both nostrils 2 (two) times daily. 08/01/22   [provider]  montelukast  (SINGULAIR ) 10 MG tablet Take 1 tablet (10 mg total) by mouth at bedtime. 07/25/23   Mazie Speed, MD    Family History Family History  Problem Relation Age of Onset   Hypertension Father    Hyperlipidemia Father    Heart disease Maternal Grandfather    Prostate cancer Neg Hx    Bladder Cancer Neg Hx    Kidney cancer Neg Hx     Social History Social History   Tobacco Use   Smoking status: Never   Smokeless tobacco: Never  Vaping Use   Vaping status: Never Used  Substance Use Topics   Alcohol use: Never   Drug use: Never     Allergies   Keflex [cephalexin]  Review of Systems Review of Systems  Constitutional:  Negative for chills and fever.  Gastrointestinal:  Positive for abdominal pain. Negative for blood in stool, constipation, diarrhea, nausea and vomiting.  Genitourinary:  Positive for dysuria, frequency and urgency. Negative for flank pain, hematuria, pelvic pain and vaginal discharge.     Physical Exam Triage Vital Signs ED Triage Vitals  Encounter Vitals Group     BP      Systolic BP Percentile      Diastolic BP Percentile      Pulse      Resp      Temp      Temp src      SpO2      Weight      Height      Head Circumference      Peak Flow      Pain Score      Pain Loc      Pain Education      Exclude from Growth Chart    No data found.  Updated Vital Signs BP 116/76   Pulse 75   Temp 98  F (36.7 C)   Resp 18   LMP 08/14/2023   SpO2 98%   Visual Acuity Right Eye Distance:   Left Eye Distance:   Bilateral Distance:    Right Eye Near:   Left Eye Near:    Bilateral Near:     Physical Exam Constitutional:      General: She is not in acute distress. HENT:     Mouth/Throat:     Mouth: Mucous membranes are moist.  Cardiovascular:     Rate and Rhythm: Normal rate and regular rhythm.  Pulmonary:     Effort: Pulmonary effort is normal. No respiratory distress.  Abdominal:     General: Bowel sounds are normal.     Palpations: Abdomen is soft.     Tenderness: There is no abdominal tenderness. There is no right CVA tenderness, left CVA tenderness, guarding or rebound.  Neurological:     Mental Status: She is alert.      UC Treatments / Results  Labs (all labs ordered are listed, but only abnormal results are displayed) Labs Reviewed  POCT URINALYSIS DIP (MANUAL ENTRY) - Abnormal; Notable for the following components:      Result Value   Clarity, UA hazy (*)    Ketones, POC UA trace (5) (*)    Protein Ur, POC trace (*)    Nitrite, UA Positive (*)    All other components within normal limits  POCT URINE PREGNANCY - Normal  URINE CULTURE    EKG   Radiology No results found.  Procedures Procedures (including critical care time)  Medications Ordered in UC Medications - No data to display  Initial Impression / Assessment and Plan / UC Course  I have reviewed the triage vital signs and the nursing notes.  Pertinent labs & imaging results that were available during my care of the patient were reviewed by me and considered in my medical decision making (see chart for details).    Dysuria.  Treating with Macrobid . Urine culture pending. Discussed with patient that we will call her if the urine culture shows the need to change or discontinue the antibiotic. Instructed her to follow-up with her PCP if her symptoms are not improving. Patient agrees to plan  of care.     Final Clinical Impressions(s) / UC Diagnoses   Final diagnoses:  Dysuria  Discharge Instructions      Take the antibiotic as directed.  The urine culture is pending.  We will call you if it shows the need to change or discontinue your antibiotic.    Follow-up with your primary care provider if your symptoms are not improving.    ED Prescriptions     Medication Sig Dispense Auth. Provider   nitrofurantoin , macrocrystal-monohydrate, (MACROBID ) 100 MG capsule Take 1 capsule (100 mg total) by mouth 2 (two) times daily. 10 capsule Wellington Half, NP      PDMP not reviewed this encounter.   Wellington Half, NP 08/26/23 [phone]

## 2023-08-28 ENCOUNTER — Ambulatory Visit (HOSPITAL_COMMUNITY): Payer: Self-pay

## 2023-08-28 LAB — URINE CULTURE: Culture: 10000 — AB

## 2023-08-29 ENCOUNTER — Encounter: Payer: Self-pay | Admitting: Family Medicine

## 2023-09-02 ENCOUNTER — Encounter: Payer: Self-pay | Admitting: Family Medicine

## 2023-09-02 ENCOUNTER — Ambulatory Visit: Admitting: Family Medicine

## 2023-09-02 VITALS — BP 120/76 | HR 83 | Temp 98.1°F | Resp 16 | Wt 190.3 lb

## 2023-09-02 DIAGNOSIS — F411 Generalized anxiety disorder: Secondary | ICD-10-CM | POA: Diagnosis not present

## 2023-09-02 DIAGNOSIS — R300 Dysuria: Secondary | ICD-10-CM

## 2023-09-02 LAB — POCT URINALYSIS DIPSTICK
Bilirubin, UA: NEGATIVE
Blood, UA: NEGATIVE
Glucose, UA: NEGATIVE
Ketones, UA: NEGATIVE
Leukocytes, UA: NEGATIVE
Nitrite, UA: NEGATIVE
Protein, UA: NEGATIVE
Spec Grav, UA: <=1.005 — AB (ref 1.010–1.025)
Urobilinogen, UA: 0.2 U/dL
pH, UA: 8 (ref 5.0–8.0)

## 2023-09-02 NOTE — Progress Notes (Signed)
 Acute visit   Patient: Erica Scott   DOB: Mar 09, 2001   22 y.o. Female  MRN: [id number] PCP: Mazie Speed, MD   Chief Complaint  Patient presents with   Dysuria    Patient was seen at [facility] for Dysuria 06/09 and treated with Macrobid    Subjective    Discussed the use of AI scribe software for clinical note transcription with the patient, who gave verbal consent to proceed.  History of Present Illness   Shabrea Weldin is a 23 year old female who presents with persistent urinary tract infection symptoms.  A week ago, she was diagnosed with a UTI at urgent care and completed a five-day course of Macrobid , experiencing some improvement. However, symptoms persist with bladder pressure and back pain after urination. A urine culture identified E. coli, which was pan-sensitive to Macrobid . She has no vaginal discharge and is not sexually active. Her current medications include Macrobid , which she has completed.        Review of Systems  Objective    BP 120/76 (BP Location: Left Arm, Patient Position: Sitting, Cuff Size: Normal)   Pulse 83   Temp 98.1 F (36.7 C) (Oral)   Resp 16   Wt 190 lb 4.8 oz (86.3 kg)   LMP 08/14/2023   BMI 29.81 kg/m  Physical Exam Vitals reviewed.  Constitutional:      General: She is not in acute distress.    Appearance: She is well-developed.  HENT:     Head: Normocephalic and atraumatic.   Eyes:     General: No scleral icterus.    Conjunctiva/sclera: Conjunctivae normal.    Cardiovascular:     Rate and Rhythm: Normal rate and regular rhythm.     Heart sounds: Normal heart sounds. No murmur heard. Pulmonary:     Effort: Pulmonary effort is normal. No respiratory distress.     Breath sounds: Normal breath sounds. No wheezing or rales.  Abdominal:     General: There is no distension.     Palpations: Abdomen is soft.     Tenderness: There is no abdominal tenderness. There is no right CVA tenderness, left CVA tenderness,  guarding or rebound.   Skin:    General: Skin is warm and dry.     Capillary Refill: Capillary refill takes less than 2 seconds.     Findings: No rash.   Neurological:     Mental Status: She is alert and oriented to person, place, and time.   Psychiatric:        Behavior: Behavior normal.       Results for orders placed or performed in visit on 09/02/23  POCT urinalysis dipstick  Result Value Ref Range   Color, UA Light Yellow    Clarity, UA Clear    Glucose, UA Negative Negative   Bilirubin, UA Negative    Ketones, UA Negative    Spec Grav, UA <=1.005 (A) 1.010 - 1.025   Blood, UA Negative    pH, UA 8.0 5.0 - 8.0   Protein, UA Negative Negative   Urobilinogen, UA 0.2 0.2 or 1.0 E.U./dL   Nitrite, UA Negative    Leukocytes, UA Negative Negative   Appearance     Odor      Assessment & Plan     Problem List Items Addressed This Visit       Other   GAD (generalized anxiety disorder)   Other Visit Diagnoses       Dysuria    -  Primary   Relevant Orders   POCT urinalysis dipstick (Completed)   Urine Culture          Urinary tract infection due to E. coli Persistent UTI symptoms despite a 5-day course of Macrobid . Initial urine culture showed 10,000 colonies of pan-sensitive E. coli. Current urinalysis is negative for nitrites and leukocytes, suggesting possible resolution. Symptoms may be due to residual bladder irritation. She prefers to wait for repeat culture results before starting a new antibiotic. Discussed risks of unnecessary antibiotic use, including side effects and resistance. - Send urine culture to confirm clearance of E. coli. - Recommend Azo for symptomatic relief of bladder discomfort. - Advise hydration and consider cranberry juice for bladder health. - Await culture results before initiating further antibiotic treatment.  GAD Desires to switch from Zoloft  to Prozac  as previously discussed. No tapering needed as Prozac  will maintain serotonin  levels. She is comfortable with the direct switch plan. - Discontinue Zoloft . - Initiate Prozac  immediately after stopping Zoloft .       No orders of the defined types were placed in this encounter.    Return in about 2 months (around 11/02/2023) for MDD/GAD f/u, as scheduled.      Aden Agreste, MD  [facility] [phone] (phone) [phone] (fax)  [facility]

## 2023-09-02 NOTE — Telephone Encounter (Signed)
 See OV  note

## 2023-09-04 ENCOUNTER — Encounter: Payer: Self-pay | Admitting: Family Medicine

## 2023-09-05 ENCOUNTER — Ambulatory Visit: Admitting: Family Medicine

## 2023-09-05 LAB — URINE CULTURE

## 2023-09-06 ENCOUNTER — Ambulatory Visit: Payer: Self-pay | Admitting: Family Medicine

## 2023-09-06 ENCOUNTER — Telehealth: Payer: Self-pay

## 2023-09-06 ENCOUNTER — Other Ambulatory Visit: Payer: Self-pay

## 2023-09-06 DIAGNOSIS — R300 Dysuria: Secondary | ICD-10-CM

## 2023-09-06 MED ORDER — SULFAMETHOXAZOLE-TRIMETHOPRIM 800-160 MG PO TABS: 1.0000 | ORAL_TABLET | Freq: Two times a day (BID) | ORAL | 0 refills | Status: DC

## 2023-09-06 NOTE — Telephone Encounter (Signed)
 Spoke with pt and made aware of medication being sent. Also made aware per Dr. Note 5 days was sufficient. Pt verbalized understanding.

## 2023-09-06 NOTE — Telephone Encounter (Signed)
 See result note.

## 2023-09-06 NOTE — Telephone Encounter (Signed)
 Copied from CRM [id number]. Topic: Clinical - Medication Question >> Sep 06, 2023 12:09 PM Bearl Botts E wrote: Reason for CRM: Pt called regarding her urine culture results. She wants to know what will be called in for her, wants to have an extended treatment of medication per her mychart message. Please advise   Best contact: [phone]

## 2023-09-06 NOTE — Telephone Encounter (Signed)
Please advise on pt message

## 2023-09-06 NOTE — Telephone Encounter (Signed)
 DS is the dose (you'll see when you order).  Also 5 days is sufficient

## 2023-09-06 NOTE — Telephone Encounter (Signed)
Multiple encounters. Closing this one

## 2023-09-07 ENCOUNTER — Encounter: Payer: Self-pay | Admitting: Family Medicine

## 2023-09-10 ENCOUNTER — Ambulatory Visit: Admitting: Family Medicine

## 2023-09-16 ENCOUNTER — Ambulatory Visit: Payer: Self-pay

## 2023-09-16 NOTE — Telephone Encounter (Signed)
 FYI Only or Action Required?: Action required by provider: clinical question for provider.  Patient was last seen in primary care on 09/02/2023 by Myrla Jon HERO, MD. Called Nurse Triage reporting UTI. Symptoms began a week ago. Interventions attempted: Prescription medications: Macrobid . Symptoms are: gradually worsening.  Triage Disposition: See Physician Within 24 Hours  Patient/caregiver understands and will follow disposition?: Yes    Copied from CRM [id number]. Topic: Clinical - Red Word Triage >> Sep 16, 2023  8:28 AM Powell HERO wrote: Red Word that prompted transfer to Nurse Triage: Patient has finished a 5 day antibiotic and all UTI symptoms have worsened/returned, looking for advice on what she should do Reason for Disposition  Side (flank) or lower back pain present  Answer Assessment - Initial Assessment Questions Patient had finished 5 day course of Macrobid  before being seen on the 16th, was placed on additional 5 days of Bactrim  and then symptoms have returned for last week . Patient made earliest availability appt for 4 pm tomorrow but is asking if PCP can call her in extended antibiotics and if she can cancel the appt for tomorrow. Please contact patient via telephone or mychart if this is an option. If meds can be called in, she would like them sent to [facility] in [place] that is listed on her pharmacy profile.   1. SYMPTOM: What's the main symptom you're concerned about? (e.g., frequency, incontinence)     Full return of UTI symptoms, burning with urination, increased frequency 2. ONSET: When did the  symptoms  start?     1 week ago 3. PAIN: Is there any pain? If Yes, ask: How bad is it? (Scale: 1-10; mild, moderate, severe)     Yes rated 7 on pain scale 4. CAUSE: What do you think is causing the symptoms?     UTI 5. OTHER SYMPTOMS: Do you have any other symptoms? (e.g., blood in urine, fever, flank pain, pain with urination)     Abdominal pain and back pain 6.  PREGNANCY: Is there any chance you are pregnant? When was your last menstrual period?     No  Denies: fever, blood in urine  Protocols used: Urinary Symptoms-A-AH

## 2023-09-17 ENCOUNTER — Ambulatory Visit: Admitting: Family Medicine

## 2023-09-17 ENCOUNTER — Encounter: Payer: Self-pay | Admitting: Family Medicine

## 2023-09-17 VITALS — BP 134/73 | HR 85 | Ht 67.0 in | Wt 191.0 lb

## 2023-09-17 DIAGNOSIS — R300 Dysuria: Secondary | ICD-10-CM

## 2023-09-17 DIAGNOSIS — B9620 Unspecified Escherichia coli [E. coli] as the cause of diseases classified elsewhere: Secondary | ICD-10-CM

## 2023-09-17 DIAGNOSIS — N390 Urinary tract infection, site not specified: Secondary | ICD-10-CM | POA: Diagnosis not present

## 2023-09-17 MED ORDER — SULFAMETHOXAZOLE-TRIMETHOPRIM 800-160 MG PO TABS: 1.0000 | ORAL_TABLET | Freq: Two times a day (BID) | ORAL | 0 refills | Status: AC

## 2023-09-17 NOTE — Telephone Encounter (Signed)
 Agree with her being seen/re-evaluated

## 2023-09-17 NOTE — Progress Notes (Signed)
 ACUTE VISIT   Patient: Erica Scott   DOB: 2000-09-16   23 y.o. Female  MRN: [id number]   PCP: Myrla Jon HERO, MD  Chief Complaint  Patient presents with   Urinary Tract Infection    She was seen and treat at UC 5 days before she came in to see Dr B whom also treat her for % days she stated she felt better and now she is experiencing the symptoms again (burning after she urinates, abdominal and back pain) she was done with treatment in Wed by Friday the symptoms came back    Subjective    HPI HPI     Urinary Tract Infection    Additional comments: She was seen and treat at UC 5 days before she came in to see Dr B whom also treat her for % days she stated she felt better and now she is experiencing the symptoms again (burning after she urinates, abdominal and back pain) she was done with treatment in Wed by Friday the symptoms came back       Last edited by Thelbert Eulalio HERO, CMA on 09/17/2023  4:06 PM.       Discussed the use of AI scribe software for clinical note transcription with the patient, who gave verbal consent to proceed.  History of Present Illness Erica Scott is a 23 year old female with recurrent urinary tract infections who presents with continued UTI symptoms.  She has ongoing symptoms of a urinary tract infection despite previous treatments. Initially, she was prescribed a five-day course of Macrobid  after a culture grew an E. coli infection. She felt better after completing the antibiotics, but symptoms returned shortly after.  She then consulted her primary care doctor, who conducted another culture that again showed E. coli. She was prescribed another five-day course of antibiotics, which she completed last Wednesday. However, by Friday, her symptoms returned 'full force'.  Her symptoms include burning during urination, frequent urination, and severe back and abdominal pain after urination. Symptoms typically return about two days after  completing antibiotic treatment. No vaginal discharge, but she reports a lot of pressure in the suprapubic area, feeling like she needs to urinate even when she does not.  She has a history of recurrent UTIs during her teenage years, for which she saw a urologist while still under pediatric care. 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 She has no children but owns a dog.     Medications: Outpatient Medications Prior to Visit  Medication Sig   fexofenadine  (ALLEGRA ) 180 MG tablet Take 1 tablet (180 mg total) by mouth daily.   FLUoxetine  (PROZAC ) 20 MG capsule Take 1 capsule (20 mg total) by mouth daily.   fluticasone (FLONASE) 50 MCG/ACT nasal spray Place 1 spray into both nostrils 2 (two) times daily.   montelukast  (SINGULAIR ) 10 MG tablet Take 1 tablet (10 mg total) by mouth at bedtime.   [DISCONTINUED] sulfamethoxazole -trimethoprim  (BACTRIM  DS) 800-160 MG tablet Take 1 tablet by mouth 2 (two) times daily. (Patient not taking: Reported on 09/17/2023)   No facility-administered medications prior to visit.   Results for orders placed or performed in visit on 09/02/23  Urine Culture     Status: Abnormal   Collection Time: 09/02/23 12:00 AM   Specimen: Urine   Urine  Result Value Ref Range Status   Urine Culture, Routine Final report (A)  Final   Organism ID, Bacteria Escherichia coli (A)  Final  Comment: Cefazolin with an MIC <=16 predicts susceptibility to the oral agents cefaclor, cefdinir, cefpodoxime, cefprozil, cefuroxime, cephalexin, and loracarbef when used for therapy of uncomplicated urinary tract infections due to E. coli, Klebsiella pneumoniae, and Proteus mirabilis. 25,000-50,000 colony forming units per mL    ORGANISM ID, BACTERIA Comment (A)  Final    Comment: Beta hemolytic Streptococcus, group B 10,000-25,000 colony forming units per mL Penicillin and ampicillin are drugs of choice for treatment of beta-hemolytic streptococcal infections. Susceptibility  testing of penicillins and other beta-lactam agents approved by the FDA for treatment of beta-hemolytic streptococcal infections need not be performed routinely because nonsusceptible isolates are extremely rare in any beta-hemolytic streptococcus and have not been reported for Streptococcus pyogenes (group A). (CLSI)    Antimicrobial Susceptibility Comment  Final    Comment:       ** S = Susceptible; I = Intermediate; R = Resistant **                    P = Positive; N = Negative             MICS are expressed in micrograms per mL    Antibiotic                 RSLT#1    RSLT#2    RSLT#3    RSLT#4 Amoxicillin /Clavulanic Acid    S Ampicillin                     S Cefazolin                      S Cefepime                       S Cefoxitin                      S Cefpodoxime                    S Ceftriaxone                     S Ciprofloxacin                  S Ertapenem                      S Gentamicin                     S Levofloxacin                   S Meropenem                      S Nitrofurantoin                  S Piperacillin/Tazobactam        S Tetracycline                   S Tobramycin                     S Trimethoprim /Sulfa              S      Last metabolic panel Lab Results  Component Value Date   GLUCOSE 89 10/29/2022   NA 139 10/29/2022   K 4.5 10/29/2022   CL 105 10/29/2022   CO2  19 (L) 10/29/2022   BUN 9 10/29/2022   CREATININE 0.85 10/29/2022   EGFR 100 10/29/2022   CALCIUM 9.7 10/29/2022   PROT 7.0 10/29/2022   ALBUMIN 4.6 10/29/2022   LABGLOB 2.4 10/29/2022   BILITOT 0.2 10/29/2022   ALKPHOS 47 10/29/2022   AST 19 10/29/2022   ALT 21 10/29/2022        Objective    BP 134/73   Pulse 85   Ht 5' 7 (1.702 m)   Wt 191 lb (86.6 kg)   LMP 08/14/2023   SpO2 100%   BMI 29.91 kg/m  BP Readings from Last 3 Encounters:  09/17/23 134/73  09/02/23 120/76  08/26/23 116/76   Wt Readings from Last 3 Encounters:  09/17/23 191 lb (86.6 kg)   09/02/23 190 lb 4.8 oz (86.3 kg)  07/25/23 193 lb 6.4 oz (87.7 kg)      Physical Exam   Physical Exam ABDOMEN: Suprapubic pressure, non-tender, nontender, nondistended   No results found for any visits on 09/17/23.  Assessment & Plan      Assessment & Plan Recurrent Urinary Tract Infection (UTI) She experiences recurrent UTI symptoms, including dysuria, frequency, back pain, and post-urination abdominal pain. Previous treatments with Macrobid  and Bactrim  provided temporary relief, but symptoms recurred two days post-treatment. Urine culture indicated E. coli sensitive to the antibiotics used. The previous treatment duration may have been insufficient, necessitating an extended antibiotic course to ensure complete eradication. Discussed potential prophylactic antibiotics if the condition persists and possible referral to a urologist or gynecologist if symptoms recur. Informed consent included discussion of the risk of disrupting gut flora with extended antibiotic use and potential need for further specialist evaluation if symptoms persist. - Prescribe Bactrim  800-160 mg twice daily for 10 days - Order basic metabolic panel to assess kidney function - Order CBC to check white blood cell count - Consider referral to urologist or gynecologist if symptoms recur after treatment      No follow-ups on file.        Rockie Agent, MD  [facility] [phone] (phone) [phone] (fax)  [facility]

## 2023-09-18 ENCOUNTER — Ambulatory Visit: Payer: Self-pay | Admitting: Family Medicine

## 2023-09-18 LAB — BMP8+EGFR
BUN/Creatinine Ratio: 11 (ref 9–23)
BUN: 8 mg/dL (ref 6–20)
CO2: 21 mmol/L (ref 20–29)
Calcium: 10 mg/dL (ref 8.7–10.2)
Chloride: 106 mmol/L (ref 96–106)
Creatinine, Ser: 0.76 mg/dL (ref 0.57–1.00)
Glucose: 91 mg/dL (ref 70–99)
Potassium: 4.5 mmol/L (ref 3.5–5.2)
Sodium: 141 mmol/L (ref 134–144)
eGFR: 114 mL/min/{1.73_m2} (ref >59)

## 2023-09-18 LAB — CBC
Hematocrit: 41.6 % (ref 34.0–46.6)
Hemoglobin: 13.6 g/dL (ref 11.1–15.9)
MCH: 31 pg (ref 26.6–33.0)
MCHC: 32.7 g/dL (ref 31.5–35.7)
MCV: 95 fL (ref 79–97)
Platelets: 413 10*3/uL (ref 150–450)
RBC: 4.39 x10E6/uL (ref 3.77–5.28)
RDW: 12.3 % (ref 11.7–15.4)
WBC: 7.3 10*3/uL (ref 3.4–10.8)

## 2023-09-23 ENCOUNTER — Encounter: Payer: Self-pay | Admitting: Family Medicine

## 2023-09-23 NOTE — Telephone Encounter (Signed)
Please see the message below,

## 2023-09-30 ENCOUNTER — Ambulatory Visit: Payer: Self-pay | Admitting: *Deleted

## 2023-09-30 NOTE — Telephone Encounter (Signed)
 FYI Only or Action Required?: FYI only for provider.  Patient was last seen in primary care on 09/17/2023 by Sharma Coyer, MD.  Called Nurse Triage reporting Dysuria.  Symptoms began several weeks ago.  Interventions attempted: Prescription medications: Bactrim .  Symptoms are: unchanged.  Triage Disposition: See Within 3 Days in Office (overriding See Physician Within 24 Hours)  Patient/caregiver understands and will follow disposition?: Yes                              Reason for Disposition  [1] Taking antibiotic > 72 hours (3 days) for UTI AND [2] painful urination or frequency is SAME (unchanged, not better)  Answer Assessment - Initial Assessment Questions 1. MAIN SYMPTOM: What is the main symptom you are concerned about? (e.g., painful urination, urine frequency)     Painful urination  2. BETTER-SAME-WORSE: Are you getting better, staying the same, or getting worse compared to how you felt at your last visit to the doctor (most recent medical visit)?     Better, but not resolved  3. PAIN: How bad is the pain?  (e.g., Scale 1-10; mild, moderate, or severe)     Rates pain a 6  4. FEVER: Do you have a fever? If Yes, ask: What is it, how was it measured, and when did it start?     Denies 5. OTHER SYMPTOMS: Do you have any other symptoms? (e.g., blood in the urine, flank pain, vaginal discharge)     Abdominal pain, flank pain, denies hematuria  6. DIAGNOSIS: When was the UTI diagnosed? By whom? Was it a kidney infection, bladder infection or both?     Yes at OV x 2 7. ANTIBIOTIC: What antibiotic(s) are you taking? How many times per day?     Bactrim , finished on Friday of last week  8. ANTIBIOTIC - START DATE: When did you start taking the antibiotic?     09/17/23    Scheduled patient for first available appointment. CAL on lunch and could not contact for further assistance with scheduling sooner. Please contact  patient if there are any openings today or tomorrow.  Protocols used: Urinary Tract Infection on Antibiotic Follow-up Call - [facility]

## 2023-09-30 NOTE — Telephone Encounter (Signed)
 Called patient to review on going sx of UTI.  See message from provider 09/23/23. No answer, LVMTCB [phone].    Copied from CRM [id number]. Topic: Clinical - Medical Advice >> Sep 30, 2023 11:41 AM Myrick T wrote: Reason for CRM: possible UTI with urgency to urinate with burning and abdomen and back after use. Please f/u with patient for appt or medication   ----------------------------------------------------------------------- From previous Reason for Contact - Scheduling: Patient/patient representative is calling to schedule an appointment. Refer to attachments for appointment information.

## 2023-10-02 ENCOUNTER — Ambulatory Visit: Admitting: Family Medicine

## 2023-10-02 ENCOUNTER — Encounter: Payer: Self-pay | Admitting: Family Medicine

## 2023-10-02 VITALS — BP 123/73 | HR 84 | Ht 67.0 in | Wt 189.0 lb

## 2023-10-02 DIAGNOSIS — N390 Urinary tract infection, site not specified: Secondary | ICD-10-CM | POA: Insufficient documentation

## 2023-10-02 DIAGNOSIS — B9620 Unspecified Escherichia coli [E. coli] as the cause of diseases classified elsewhere: Secondary | ICD-10-CM

## 2023-10-02 DIAGNOSIS — R300 Dysuria: Secondary | ICD-10-CM | POA: Diagnosis not present

## 2023-10-02 LAB — POCT URINALYSIS DIPSTICK
Bilirubin, UA: NEGATIVE
Blood, UA: NEGATIVE
Glucose, UA: NEGATIVE
Ketones, UA: NEGATIVE
Leukocytes, UA: NEGATIVE
Nitrite, UA: NEGATIVE
Protein, UA: POSITIVE — AB
Spec Grav, UA: 1.02 (ref 1.010–1.025)
Urobilinogen, UA: 2 U/dL — AB
pH, UA: 6 (ref 5.0–8.0)

## 2023-10-02 MED ORDER — PHENAZOPYRIDINE HCL 200 MG PO TABS: 200.0000 mg | ORAL_TABLET | Freq: Three times a day (TID) | ORAL | 0 refills | Status: DC | PRN

## 2023-10-02 MED ORDER — CIPROFLOXACIN HCL 500 MG PO TABS: 500.0000 mg | ORAL_TABLET | Freq: Two times a day (BID) | ORAL | 0 refills | Status: AC

## 2023-10-02 MED ORDER — TRAMADOL HCL 50 MG PO TABS
50.0000 mg | ORAL_TABLET | Freq: Two times a day (BID) | ORAL | 0 refills | Status: AC | PRN
Start: 2023-10-02 — End: 2023-10-10

## 2023-10-02 NOTE — Progress Notes (Signed)
 ACUTE VISIT   Patient: Erica Scott   DOB: 29-Apr-2000   22 y.o. Female  MRN: [id number]   PCP: Myrla Jon HERO, MD  Chief Complaint  Patient presents with   Urinary Tract Infection    Still having burning during urination, she has been seen and treated, she stated she was feeling fine until she stopped the treatment, then the burning feeling returned    Subjective    HPI HPI     Urinary Tract Infection    Additional comments: Still having burning during urination, she has been seen and treated, she stated she was feeling fine until she stopped the treatment, then the burning feeling returned       Last edited by Thelbert Eulalio HERO, CMA on 10/02/2023  8:54 AM.       Discussed the use of AI scribe software for clinical note transcription with the patient, who gave verbal consent to proceed.  History of Present Illness Erica Scott is a 23 year old female with recurrent urinary tract infections who presents with dysuria after recent treatment for E. Coli urinary tract infection.  She has a history of recurrent urinary tract infections and was recently treated for an E. Coli urinary tract infection with Bactrim  for ten days. While on the antibiotic, she felt fine, but after stopping the treatment, her dysuria returned. The discomfort began approximately three days after completing the antibiotic course.  She experiences burning with urination and urgency, along with dribbling. Additionally, she reports bilateral back pain that worsens after urination, specifically in two spots on her back. This back pain became more severe yesterday.  No fevers or chills but occasionally experiences waves of nausea. She reports suprapubic pressure.     Medications: Outpatient Medications Prior to Visit  Medication Sig   fexofenadine  (ALLEGRA ) 180 MG tablet Take 1 tablet (180 mg total) by mouth daily.   FLUoxetine  (PROZAC ) 20 MG capsule Take 1 capsule (20 mg total) by  mouth daily.   fluticasone (FLONASE) 50 MCG/ACT nasal spray Place 1 spray into both nostrils 2 (two) times daily.   montelukast  (SINGULAIR ) 10 MG tablet Take 1 tablet (10 mg total) by mouth at bedtime.   No facility-administered medications prior to visit.   Lab Results  Component Value Date   COLORU Light Yellow 09/02/2023   CLARITYU Clear 09/02/2023   GLUCOSEUR Negative 09/02/2023   BILIRUBINUR Negative 09/02/2023   KETONESU Negative 09/02/2023   SPECGRAV <=1.005 (A) 09/02/2023   RBCUR Negative 09/02/2023   PHUR 8.0 09/02/2023   PROTEINUR Negative 09/02/2023   UROBILINOGEN 0.2 09/02/2023   LEUKOCYTESUR Negative 09/02/2023   Results for orders placed or performed in visit on 09/02/23  Urine Culture     Status: Abnormal   Collection Time: 09/02/23 12:00 AM   Specimen: Urine   Urine  Result Value Ref Range Status   Urine Culture, Routine Final report (A)  Final   Organism ID, Bacteria Escherichia coli (A)  Final    Comment: Cefazolin with an MIC <=16 predicts susceptibility to the oral agents cefaclor, cefdinir, cefpodoxime, cefprozil, cefuroxime, cephalexin, and loracarbef when used for therapy of uncomplicated urinary tract infections due to E. coli, Klebsiella pneumoniae, and Proteus mirabilis. 25,000-50,000 colony forming units per mL    ORGANISM ID, BACTERIA Comment (A)  Final    Comment: Beta hemolytic Streptococcus, group B 10,000-25,000 colony forming units per mL Penicillin and ampicillin are drugs of choice for treatment of beta-hemolytic streptococcal infections. Susceptibility testing of  penicillins and other beta-lactam agents approved by the FDA for treatment of beta-hemolytic streptococcal infections need not be performed routinely because nonsusceptible isolates are extremely rare in any beta-hemolytic streptococcus and have not been reported for Streptococcus pyogenes (group A). (CLSI)    Antimicrobial Susceptibility Comment  Final    Comment:       ** S  = Susceptible; I = Intermediate; R = Resistant **                    P = Positive; N = Negative             MICS are expressed in micrograms per mL    Antibiotic                 RSLT#1    RSLT#2    RSLT#3    RSLT#4 Amoxicillin /Clavulanic Acid    S Ampicillin                     S Cefazolin                      S Cefepime                       S Cefoxitin                      S Cefpodoxime                    S Ceftriaxone                     S Ciprofloxacin                   S Ertapenem                      S Gentamicin                     S Levofloxacin                   S Meropenem                      S Nitrofurantoin                  S Piperacillin/Tazobactam        S Tetracycline                   S Tobramycin                     S Trimethoprim /Sulfa              S      Last metabolic panel Lab Results  Component Value Date   GLUCOSE 91 09/17/2023   NA 141 09/17/2023   K 4.5 09/17/2023   CL 106 09/17/2023   CO2 21 09/17/2023   BUN 8 09/17/2023   CREATININE 0.76 09/17/2023   EGFR 114 09/17/2023   CALCIUM 10.0 09/17/2023   PROT 7.0 10/29/2022   ALBUMIN 4.6 10/29/2022   LABGLOB 2.4 10/29/2022   BILITOT 0.2 10/29/2022   ALKPHOS 47 10/29/2022   AST 19 10/29/2022   ALT 21 10/29/2022        Objective    BP 123/73   Pulse 84   Ht 5' 7 (1.702 m)   Wt 189  lb (85.7 kg)   SpO2 100%   BMI 29.60 kg/m  BP Readings from Last 3 Encounters:  10/02/23 123/73  09/17/23 134/73  09/02/23 120/76   Wt Readings from Last 3 Encounters:  10/02/23 189 lb (85.7 kg)  09/17/23 191 lb (86.6 kg)  09/02/23 190 lb 4.8 oz (86.3 kg)      Physical Exam   Physical Exam GENERAL: Ill appearing but nontoxic. ABDOMEN: Bilateral costovertebral angle tenderness. No abdominal tenderness.   No results found for any visits on 10/02/23.  Assessment & Plan      Assessment & Plan Recurrent Urinary Tract Infection (UTI) A 23 year old female presents with dysuria following a 10-day  course of Bactrim  for an E. Coli UTI. Symptoms, including burning with urination, urgency, and dribbling, recurred three days post-treatment. Bilateral costovertebral angle tenderness and suprapubic pressure are present, but there is no fever or chills. Previous kidney function and white blood cell count were normal. Concern exists for a complicated UTI or potential progression to pyelonephritis, though the absence of fever and normal kidney function are reassuring. Referral to urogynecology is recommended to evaluate the need for prophylactic antibiotics or other management strategies. - Repeat urine culture and urinalysis to assess for resistant E. Coli or other pathogens. - Prescribe Pyridium  200 mg three times a day as needed for urinary discomfort, 20 tablets. - Refer to urogynecology for further evaluation and management, including consideration of prophylactic antibiotics. - Consider treatment with ciprofloxacin  500 mg twice daily for 7 days if indicated by culture results. - Pt reports considerable pain with symptoms, will send 50mg  Tramadol  PRN every 12 hours   Back Pain Bilateral back pain worsens after urination, with tenderness in the costovertebral angle, raising concern for possible pyelonephritis. The absence of fever and normal kidney function are reassuring. Monitoring is necessary to prevent progression to a more serious condition.      Return if symptoms worsen or fail to improve.        Rockie Agent, MD  [facility] [phone] (phone) [phone] (fax)  [facility]

## 2023-10-02 NOTE — Telephone Encounter (Signed)
 Saw Dr Lang for this today

## 2023-10-04 LAB — URINE CULTURE

## 2023-10-04 LAB — SPECIMEN STATUS REPORT

## 2023-10-08 ENCOUNTER — Ambulatory Visit: Payer: Self-pay | Admitting: Family Medicine

## 2023-10-09 ENCOUNTER — Encounter: Payer: Self-pay | Admitting: Family Medicine

## 2023-10-10 ENCOUNTER — Other Ambulatory Visit: Payer: Self-pay | Admitting: Family Medicine

## 2023-10-10 DIAGNOSIS — N390 Urinary tract infection, site not specified: Secondary | ICD-10-CM

## 2023-10-10 NOTE — Progress Notes (Signed)
 Pt will return to office for urine culture for persistent urinary symptoms. Please send any additional questions to patient's PCP

## 2023-10-10 NOTE — Telephone Encounter (Signed)
 Can someone please scheduled the pt to come in to drop off urine

## 2023-10-16 DIAGNOSIS — N390 Urinary tract infection, site not specified: Secondary | ICD-10-CM | POA: Diagnosis not present

## 2023-10-20 ENCOUNTER — Encounter: Payer: Self-pay | Admitting: Family Medicine

## 2023-10-20 LAB — URINE CULTURE

## 2023-10-23 ENCOUNTER — Ambulatory Visit: Payer: Self-pay

## 2023-10-25 NOTE — Progress Notes (Signed)
 Results seen by patient Erica Scott on 10/24/2023 @ 12:04 pm.

## 2023-10-28 ENCOUNTER — Encounter: Payer: Self-pay | Admitting: Family Medicine

## 2023-10-28 ENCOUNTER — Ambulatory Visit (INDEPENDENT_AMBULATORY_CARE_PROVIDER_SITE_OTHER): Admitting: Family Medicine

## 2023-10-28 VITALS — BP 124/68 | HR 99 | Resp 16 | Ht 67.0 in | Wt 192.2 lb

## 2023-10-28 DIAGNOSIS — Z0000 Encounter for general adult medical examination without abnormal findings: Secondary | ICD-10-CM

## 2023-10-28 DIAGNOSIS — I951 Orthostatic hypotension: Secondary | ICD-10-CM

## 2023-10-28 DIAGNOSIS — F411 Generalized anxiety disorder: Secondary | ICD-10-CM | POA: Diagnosis not present

## 2023-10-28 MED ORDER — SERTRALINE HCL 100 MG PO TABS: 150.0000 mg | ORAL_TABLET | Freq: Every day | ORAL | 3 refills | Status: DC

## 2023-10-28 NOTE — Progress Notes (Signed)
 Complete physical exam  Patient: Erica Scott   DOB: [birth year]   22 y.o. Female  MRN: [id number]  Subjective:    Chief Complaint  Patient presents with   Annual Exam    Patient overall doing well. She is exercising.She is sleeping fairly well. She see GYN    Vickey Boak is a 23 y.o. female who presents today for a complete physical exam. She reports consuming a general and protein filled diet. Home exercise routine includes walking 1 hrs per day. She generally feels well. She reports sleeping fairly well. She does have additional problems to discuss today.   She reports wanting to discuss her episodes of orthostatic hypotension. Her most recent episode had a reading of 63/49. She reports feeling better after a few minutes and denies suffering any falls. She has been experiencing these episodes daily since the beginning of this year. She denies any associated symptoms.  Most recent fall risk assessment:    10/28/2023    3:27 PM  Fall Risk   Falls in the past year? 0  Number falls in past yr: 0  Injury with Fall? 0  Risk for fall due to : No Fall Risks     Most recent depression screenings:    10/28/2023    3:28 PM 09/02/2023   10:20 AM  PHQ 2/9 Scores  PHQ - 2 Score 0 0      10/28/2023    3:28 PM 09/02/2023   10:20 AM 07/08/2023    4:14 PM 03/26/2023    3:58 PM  GAD 7 : Generalized Anxiety Score  Nervous, Anxious, on Edge 0 0 0 0  Control/stop worrying 0 0 0 0  Worry too much - different things 0 0 0 1  Trouble relaxing 0 0 0 0  Restless 0 0 0 0  Easily annoyed or irritable 0 0 0 0  Afraid - awful might happen 0 0 0 0  Total GAD 7 Score 0 0 0 1  Anxiety Difficulty Not difficult at all Not difficult at all Not difficult at all Not difficult at all          Patient Care Team: Myrla Jon HERO, MD as PCP - General (Family Medicine)   Outpatient Medications Prior to Visit  Medication Sig   fexofenadine  (ALLEGRA ) 180 MG tablet Take 1 tablet (180  mg total) by mouth daily.   fluticasone (FLONASE) 50 MCG/ACT nasal spray Place 1 spray into both nostrils 2 (two) times daily.   montelukast  (SINGULAIR ) 10 MG tablet Take 1 tablet (10 mg total) by mouth at bedtime.   [DISCONTINUED] FLUoxetine  (PROZAC ) 20 MG capsule Take 1 capsule (20 mg total) by mouth daily.   [DISCONTINUED] phenazopyridine  (PYRIDIUM ) 200 MG tablet Take 1 tablet (200 mg total) by mouth 3 (three) times daily as needed for pain.   No facility-administered medications prior to visit.    ROS        Objective:     BP 124/68 (BP Location: Left Arm, Patient Position: Sitting, Cuff Size: Normal)   Pulse 99   Resp 16   Ht 5' 7 (1.702 m)   Wt 192 lb 3.2 oz (87.2 kg)   LMP 10/07/2023 (Exact Date)   SpO2 100%   BMI 30.10 kg/m    Physical Exam Vitals reviewed.  Constitutional:      General: She is not in acute distress.    Appearance: Normal appearance. She is not ill-appearing or diaphoretic.  HENT:     Head:  Normocephalic.     Right Ear: Tympanic membrane, ear canal and external ear normal.     Left Ear: Tympanic membrane, ear canal and external ear normal.     Nose: Nose normal.     Mouth/Throat:     Mouth: Mucous membranes are moist.     Pharynx: Oropharynx is clear. No oropharyngeal exudate or posterior oropharyngeal erythema.  Eyes:     General: No scleral icterus.    Conjunctiva/sclera: Conjunctivae normal.     Pupils: Pupils are equal, round, and reactive to light.  Cardiovascular:     Rate and Rhythm: Normal rate and regular rhythm.     Pulses: Normal pulses.     Heart sounds: Normal heart sounds. No murmur heard.    No friction rub. No gallop.  Pulmonary:     Effort: Pulmonary effort is normal. No respiratory distress.     Breath sounds: Normal breath sounds. No wheezing.  Abdominal:     General: There is no distension.     Palpations: Abdomen is soft.     Tenderness: There is no abdominal tenderness. There is no guarding.  Musculoskeletal:      Cervical back: Normal range of motion.     Right lower leg: No edema.     Left lower leg: No edema.  Lymphadenopathy:     Cervical: No cervical adenopathy.  Skin:    General: Skin is warm and dry.     Capillary Refill: Capillary refill takes less than 2 seconds.  Neurological:     Mental Status: She is alert and oriented to person, place, and time.  Psychiatric:        Mood and Affect: Mood normal.      No results found for any visits on 10/28/23.     Assessment & Plan:    Routine Health Maintenance and Physical Exam  Immunization History  Administered Date(s) Administered   DTaP 03/07/2001, 05/07/2001, 07/15/2001, 04/06/2002, 01/03/2005   HIB (PRP-OMP) 03/07/2001, 05/07/2001, 07/15/2001, 04/06/2002   HPV 9-valent 01/21/2012, 03/26/2012, 07/28/2012   Hep B, Unspecified 24-Apr-2000, 02/03/2001, 07/15/2001   Hepatitis A, Ped/Adol-2 Dose 12/15/2013, 06/16/2014   Hepb-cpg 01/11/2022, 02/13/2022   IPV 03/07/2001, 05/07/2001, 01/05/2002, 01/03/2005   Influenza Inj Mdck Quad Pf 11/27/2018   Influenza Nasal 03/06/2007, 03/08/2008, 01/27/2009, 01/07/2010, 11/22/2010   Influenza, Seasonal, Injecte, Preservative Fre 12/13/2022   Influenza,inj,Quad PF,6+ Mos 12/07/2016, 12/07/2017   Influenza-Unspecified 03/26/2019, 12/07/2021   MMR 01/05/2002, 01/03/2005   Meningococcal B, OMV 01/08/2017, 02/15/2017   Meningococcal Mcv4o 03/26/2012, 04/12/2017   PFIZER Comirnaty(Gray Top)Covid-19 Tri-Sucrose Vaccine 01/11/2020   PFIZER(Purple Top)SARS-COV-2 Vaccination 05/31/2019, 06/22/2019   PPD Test 04/14/2021, 04/24/2021   Pfizer Covid-19 Vaccine Bivalent Booster 49yrs & up 11/28/2020, 12/13/2021   Pfizer(Comirnaty)Fall Seasonal Vaccine 12 years and older 01/10/2023   Pneumococcal Conjugate PCV 7 03/07/2001, 05/07/2001, 07/15/2001, 04/06/2002   Tdap 01/21/2012, 03/08/2021   Varicella 01/05/2002, 01/07/2006    Health Maintenance  Topic Date Due   Cervical Cancer Screening (Pap smear)   Never done   COVID-19 Vaccine (7 - Pfizer risk 2024-25 season) 07/11/2023   INFLUENZA VACCINE  06/16/2024 (Originally 10/18/2023)   DTaP/Tdap/Td (8 - Td or Tdap) 03/09/2031   Hepatitis B Vaccines  Completed   HPV VACCINES  Completed   Hepatitis C Screening  Completed   HIV Screening  Completed   Meningococcal B Vaccine  Completed    Discussed health benefits of physical activity, and encouraged her to engage in regular exercise appropriate for her age and condition.  Problem List Items Addressed This Visit     GAD (generalized anxiety disorder)   Currently well controlled on medication regimen. Denies any symptoms or adverse medication side effects. Currently on Zoloft  150 mg daily instead of Prozac  as side effects . Last GAD7 was stable. GAD7 today is 0. Would like to start therapy to help with her mental health - Continue Zoloft  150 mg daily - Amb ref to psych       Relevant Medications   sertraline  (ZOLOFT ) 100 MG tablet   Other Relevant Orders   Ambulatory referral to Psychology   Other Visit Diagnoses       Encounter for annual physical exam    -  Primary     Orthostatic hypotension          Orthostatic hypotension Reports experiencing episodes of lightheadedness with decreased BP on measurement when rising from a seated or lying position. Reports hydrating well and recent blood counts rule out anemia. Likely electrolyte intake is needed to help support BP. - Increase salt/electrolyte intake - Reassess at next scheduled follow up  General Health Maintenance Discussed wellness and preventative health screenings to achieve health maintenance goals. - Encouraged flu and Covid vaccinations in the fall - Appointment scheduled October 7th with OBGYN for pap - Up to date on other wellness measures and preventative screenings   Return in about 6 months (around 04/29/2024) for chronic disease f/u.     Elia LULLA Blanch, Medical Student    Patient seen along with MS3  student, Elia Blanch. I personally evaluated this patient along with the student, and verified all aspects of the history, physical exam, and medical decision making as documented by the student. I agree with the student's documentation and have made all necessary edits.  Tekila Caillouet, Jon HERO, MD, MPH [facility]

## 2023-10-28 NOTE — Assessment & Plan Note (Addendum)
 Currently well controlled on medication regimen. Denies any symptoms or adverse medication side effects. Currently on Zoloft  150 mg daily instead of Prozac  as side effects . Last GAD7 was stable. GAD7 today is 0. Would like to start therapy to help with her mental health - Continue Zoloft  150 mg daily - Amb ref to psych

## 2023-11-04 ENCOUNTER — Ambulatory Visit: Admitting: Family Medicine

## 2023-11-10 DIAGNOSIS — J069 Acute upper respiratory infection, unspecified: Secondary | ICD-10-CM | POA: Diagnosis not present

## 2023-11-10 DIAGNOSIS — R197 Diarrhea, unspecified: Secondary | ICD-10-CM | POA: Diagnosis not present

## 2023-11-21 ENCOUNTER — Ambulatory Visit: Admitting: Family Medicine

## 2023-11-21 ENCOUNTER — Encounter: Payer: Self-pay | Admitting: Family Medicine

## 2023-11-21 VITALS — BP 117/80 | HR 97 | Temp 98.6°F | Resp 12 | Ht 67.0 in | Wt 193.2 lb

## 2023-11-21 DIAGNOSIS — H6991 Unspecified Eustachian tube disorder, right ear: Secondary | ICD-10-CM | POA: Diagnosis not present

## 2023-11-21 DIAGNOSIS — J301 Allergic rhinitis due to pollen: Secondary | ICD-10-CM

## 2023-11-21 NOTE — Progress Notes (Signed)
 Acute visit   Patient: Erica Scott   DOB: [birth year]   22 y.o. Female  MRN: [id number] PCP: Myrla Jon HERO, MD   Chief Complaint  Patient presents with   Acute Visit    Patient reports she originaaly had concerns with irritated throat but that is no longer bothering her as she believes it was due to draininge she was having but would like to discuss ear ache.   Ear Pain    She reports right ear pain started Sunday or Monday but started to become worse about 2 days ago. No form of treatment tried. Reports when her ear first began giving her trouble she was also having throat irritation but irritation no longer present.    Subjective    Discussed the use of AI scribe software for clinical note transcription with the patient, who gave verbal consent to proceed.  History of Present Illness   Erica Scott is a 23 year old female who presents with right ear pain and congestion.  She has experienced right ear pain since Sunday or Monday, following significant congestion and allergy symptoms due to changing weather. The ear pain has progressively worsened over the past few days. She has no fever, drainage, or blood from the ear, and no respiratory symptoms such as shortness of breath or cough. She recalls a previous right ear infection in April and is established with an ENT specialist. Her current medications include Allegra  and Lonox nasal spray, which she uses regularly.       Review of Systems  Objective    BP 117/80 (BP Location: Left Arm, Patient Position: Sitting, Cuff Size: Normal)   Pulse 97   Temp 98.6 F (37 C)   Resp 12   Ht 5' 7 (1.702 m)   Wt 193 lb 3.2 oz (87.6 kg)   LMP 10/07/2023 (Exact Date)   BMI 30.26 kg/m   Physical Exam Vitals reviewed.  Constitutional:      General: She is not in acute distress.    Appearance: Normal appearance. She is well-developed. She is not diaphoretic.  HENT:     Head: Normocephalic and atraumatic.     Right  Ear: Tympanic membrane, ear canal and external ear normal.     Left Ear: Tympanic membrane, ear canal and external ear normal.     Nose: Congestion present.     Mouth/Throat:     Mouth: Mucous membranes are moist.     Pharynx: Oropharynx is clear. No oropharyngeal exudate.  Eyes:     General: No scleral icterus.    Conjunctiva/sclera: Conjunctivae normal.     Pupils: Pupils are equal, round, and reactive to light.  Cardiovascular:     Rate and Rhythm: Normal rate and regular rhythm.     Heart sounds: Normal heart sounds. No murmur heard. Pulmonary:     Effort: Pulmonary effort is normal. No respiratory distress.     Breath sounds: Normal breath sounds. No wheezing or rales.  Musculoskeletal:     Cervical back: Neck supple.     Right lower leg: No edema.     Left lower leg: No edema.  Lymphadenopathy:     Cervical: No cervical adenopathy.  Skin:    General: Skin is warm and dry.     Findings: No rash.  Neurological:     Mental Status: She is alert.       No results found for any visits on 11/21/23.  Assessment & Plan  Problem List Items Addressed This Visit       Respiratory   Allergic rhinitis   Other Visit Diagnoses       Dysfunction of right eustachian tube    -  Primary           Eustachian tube dysfunction with right ear pressure Right ear pressure likely due to Eustachian tube dysfunction, associated with congestion and allergies. Symptoms have worsened over the last few days without fever or respiratory symptoms. No signs of infection or drainage. Previous similar issue in April. - Continue Flonase nasal spray. - Continue Allegra  for allergy management. - Use Afrin nasal spray for two days to relieve pressure, but avoid long-term use due to potential rebound congestion. - Consider Sudafed if Afrin is ineffective. - Avoid activities that increase ear pressure, such as swimming deep in a pool, scuba diving, and flying.  Allergic rhinitis Allergic  rhinitis contributing to congestion and Eustachian tube dysfunction. Managed with Allegra  and Flonase. - Continue Allegra  for allergy management. - Continue Flonase nasal spray.       No orders of the defined types were placed in this encounter.    Return if symptoms worsen or fail to improve.      Jon Eva, MD  [facility] [phone] (phone) [phone] (fax)  [facility]

## 2023-12-16 ENCOUNTER — Ambulatory Visit: Admitting: Family Medicine

## 2023-12-17 ENCOUNTER — Encounter: Payer: Self-pay | Admitting: Family Medicine

## 2023-12-17 DIAGNOSIS — I951 Orthostatic hypotension: Secondary | ICD-10-CM

## 2023-12-19 NOTE — Telephone Encounter (Signed)
 Recommend referral to cardiology for further eval if patient is amenable ok to send referral

## 2024-01-13 ENCOUNTER — Ambulatory Visit: Admitting: Cardiology

## 2024-01-29 ENCOUNTER — Ambulatory Visit: Admitting: Family Medicine

## 2024-01-29 ENCOUNTER — Encounter: Payer: Self-pay | Admitting: Family Medicine

## 2024-01-29 VITALS — BP 123/70 | HR 80 | Ht 67.0 in | Wt 198.4 lb

## 2024-01-29 DIAGNOSIS — J0100 Acute maxillary sinusitis, unspecified: Secondary | ICD-10-CM

## 2024-01-29 MED ORDER — AZELASTINE HCL 0.1 % NA SOLN: 1.0000 | Freq: Every day | NASAL | 12 refills | Status: DC

## 2024-01-29 MED ORDER — AMOXICILLIN-POT CLAVULANATE 875-125 MG PO TABS: 1.0000 | ORAL_TABLET | Freq: Two times a day (BID) | ORAL | 0 refills | Status: DC

## 2024-01-29 NOTE — Progress Notes (Signed)
 Acute Office Visit  Introduced to nurse practitioner role and practice setting.  All questions answered.  Discussed provider/patient relationship and expectations.   Subjective:     Patient ID: Erica Scott, female    DOB: 05/27/2000, 23 y.o.   MRN: [id number]  Chief Complaint  Patient presents with   Acute Visit    sinus, congestion, facial pain, headaches started 3 days ago. Taking sudaffed with last time taken 1 hr ago   Discussed the use of AI scribe software for clinical note transcription with the patient, who gave verbal consent to proceed.  History of Present Illness Erica Scott is a 23 year old female with allergic rhinitis who presents with sinus pain and pressure starting on 01/25/24.  She has been experiencing sinus pain and pressure in her face and head, which began with a severe frontal headache. The symptoms started approximately three to four days ago, over the weekend. She feels tired but denies feeling sick as if she had the flu or COVID-19. No fever, difficulty breathing, chest palpitations, or chest pain.  She has been taking Sudafed, which has not alleviated the pain. She alternates between Tylenol  and ibuprofen , which provide minimal relief. She takes Allegra  daily, Singulair , and Flonase for her allergic rhinitis. She switched from Claritin to Allegra  about a month ago. She has used Afrin in the past. She has not used any antihistamine sprays like Azelastine before.  No nausea or vomiting, and she is able to eat and drink normally. She does not know anyone else who has been sick recently. She has a history of chronic sinus issues.  HPI  ROS     Objective:    BP 123/70 (BP Location: Left Arm, Patient Position: Sitting, Cuff Size: Normal)   Pulse 80   Ht 5' 7 (1.702 m)   Wt 198 lb 6.4 oz (90 kg)   SpO2 100%   BMI 31.07 kg/m    Physical Exam Constitutional:      General: She is not in acute distress.    Appearance: Normal appearance. She  is well-developed. She is not ill-appearing, toxic-appearing or diaphoretic.  HENT:     Head: Normocephalic.     Right Ear: Tympanic membrane and ear canal normal. No drainage, swelling or tenderness. No middle ear effusion. Tympanic membrane is not erythematous.     Left Ear: Ear canal normal. Swelling present. No drainage or tenderness.  No middle ear effusion. Tympanic membrane is injected and erythematous.     Nose: Mucosal edema, congestion and rhinorrhea present.     Right Turbinates: Enlarged.     Left Turbinates: Enlarged.     Right Sinus: Maxillary sinus tenderness present. No frontal sinus tenderness.     Left Sinus: Maxillary sinus tenderness present. No frontal sinus tenderness.     Mouth/Throat:     Mouth: Mucous membranes are moist. No oral lesions.     Pharynx: Uvula midline. Posterior oropharyngeal erythema present. No pharyngeal swelling, oropharyngeal exudate or uvula swelling.     Tonsils: No tonsillar exudate or tonsillar abscesses. 0 on the right. 0 on the left.  Eyes:     Extraocular Movements:     Right eye: Normal extraocular motion.     Left eye: Normal extraocular motion.     Conjunctiva/sclera: Conjunctivae normal.     Pupils: Pupils are equal, round, and reactive to light.  Cardiovascular:     Rate and Rhythm: Normal rate and regular rhythm.     Heart sounds: No murmur  heard.    No gallop.  Pulmonary:     Effort: Pulmonary effort is normal. No respiratory distress.     Breath sounds: Normal breath sounds. No stridor. No wheezing, rhonchi or rales.  Chest:     Chest wall: No tenderness.  Lymphadenopathy:     Cervical: Cervical adenopathy present.  Neurological:     Mental Status: She is alert.     No results found for any visits on 01/29/24.      Assessment & Plan:  Assessment and Plan Assessment & Plan Acute maxillary sinusitis Severe facial pain and pressure, initially presenting as a headache, now localized to the sinuses. Symptoms began 4  days ago. No fever, difficulty breathing, or chest pain.  - physical Examination reveals L TM injection, mild swelling, and swollen nasal turbinates with some old dried blood - most likely from consistent nose blowing.  - very tender to maxillary sinuses -  Given the severity of symptoms and chronic sinus issues, initiation of antibiotics is considered appropriate. - No known allergies to penicillins - Prescribed Augmentin  BID for 7 days. - Recommended hot showers/use of a steamer for facial pain relief. - Advised hot tea with honey and salt water gargles. - Suggested nasal saline spray to loosen nasal congestion. - Instructed to apply Aquaphor to dry areas inside the nose. - Continue Tylenol  and ibuprofen  for headache management.  Allergic rhinitis Chronic allergic rhinitis managed with Allegra , Singulair , and Flonase. Recent switch from Claritin to Allegra . - Prescribed azelastine nasal spray for morning use. - Continue Flonase in the afternoon. - Monitor for symptom improvement with new nasal spray regimen. - if continues with no improve and given chronicity may need further eval from ENT   Problem List Items Addressed This Visit   None Visit Diagnoses       Acute non-recurrent maxillary sinusitis    -  Primary   Relevant Medications   azelastine (ASTELIN) 0.1 % nasal spray   amoxicillin -clavulanate (AUGMENTIN ) 875-125 MG tablet       Meds ordered this encounter  Medications   azelastine (ASTELIN) 0.1 % nasal spray    Sig: Place 1 spray into both nostrils daily. Use in each nostril as directed    Dispense:  30 mL    Refill:  12   amoxicillin -clavulanate (AUGMENTIN ) 875-125 MG tablet    Sig: Take 1 tablet by mouth 2 (two) times daily.    Dispense:  14 tablet    Refill:  0    Return if symptoms worsen or fail to improve.  Curtis DELENA Boom, FNP  I, Curtis DELENA Boom, FNP, have reviewed all documentation for this visit. The documentation on 01/30/24 for the exam,  diagnosis, procedures, and orders are all accurate and complete.

## 2024-01-30 ENCOUNTER — Encounter: Payer: Self-pay | Admitting: Family Medicine

## 2024-01-31 ENCOUNTER — Ambulatory Visit: Payer: Self-pay

## 2024-01-31 ENCOUNTER — Ambulatory Visit: Admitting: Family Medicine

## 2024-01-31 ENCOUNTER — Encounter: Payer: Self-pay | Admitting: Family Medicine

## 2024-01-31 NOTE — Telephone Encounter (Signed)
 I recommend patient be seen in clinic or urgent care.

## 2024-01-31 NOTE — Telephone Encounter (Signed)
 FYI Only or Action Required?: Action required by provider: clinical question for provider and update on patient condition.  Patient was last seen in primary care on 01/29/2024 by Erica Scott LABOR, FNP.  Called Nurse Triage reporting Vaginal Bleeding.  Symptoms began yesterday.  Interventions attempted: Nothing.  Symptoms are: unchanged.  Triage Disposition: Home Care  Patient/caregiver understands and will follow disposition?: No, wishes to speak with PCP      Copied from CRM #[id number]. Topic: Clinical - Red Word Triage >> Jan 31, 2024 11:51 AM Erica Scott wrote: Message copied from [name] she sent to her pcp- Marked as Urgent!  Hi! I have a question. I had my last menstrual period on November 1st. I am on month two of my birth control called Slynd. Last night I began to have bad cramping similar to when I'm on my period, I would rate this pain a 9/10 last night and a 6/10 this morning. I called my OB/GYN office (I see one out of [facility]), and they told me that I needed to get an ultrasound today, but I could not get into their office today. I am wondering if there's any way I could possibly get it done through you? I do not wish to go to the emergency department because I don't feel like my symptoms are severe enough for that. Please get back to me as soon as you can, thank you for your time! I am also experiencing vaginal bleeding.       Reason for Disposition  [1] Menstrual cycle < 21 days OR > 35 days AND [2] has occurred once this past year  Answer Assessment - Initial Assessment Questions Patient was advised by OB/GYN to get an ultrasound today. Patient wanted to know if one could be ordered for her. Please advise.      1. BLEEDING SEVERITY: Describe the bleeding that you are having. How much bleeding is there?      Heavy last night but lighter today  2. ONSET: When did the bleeding begin? Is it continuing now?     Last night  3. MENSTRUAL PERIOD: When was  the last normal menstrual period? How is this different than your period?     11/1 4. REGULARITY: How regular are your periods?     Regular  5. ABDOMEN PAIN: Do you have any pain? How bad is the pain?  (e.g., Scale 0-10; none, mild, moderate, or severe)     6/10 6. PREGNANCY: Is there any chance you are pregnant? When was your last menstrual period?     No 7. BREASTFEEDING: Are you breastfeeding?     No 8. HORMONE MEDICINES: Are you taking any hormone medicines, prescription or over-the-counter? (e.g., birth control pills, estrogen)     Slynd birth control  9. BLOOD THINNER MEDICINES: Do you take any blood thinners? (e.g., Coumadin / warfarin, Pradaxa / dabigatran, aspirin)     No 10. CAUSE: What do you think is causing the bleeding? (e.g., recent gyn surgery, recent gyn procedure; known bleeding disorder, cervical cancer, polycystic ovarian disease, fibroids)         Unsure  11. HEMODYNAMIC STATUS: Are you weak or feeling lightheaded? If Yes, ask: Can you stand and walk normally?        No 12. OTHER SYMPTOMS: What other symptoms are you having with the bleeding? (e.g., passed tissue, vaginal discharge, fever, menstrual-type cramps)       No  Protocols used: Vaginal Bleeding - Abnormal-A-AH

## 2024-02-03 ENCOUNTER — Telehealth: Payer: Self-pay

## 2024-02-03 NOTE — Telephone Encounter (Signed)
 Copied from CRM #[id number]. Topic: Clinical - Red Word Triage >> Jan 31, 2024 11:51 AM [name] wrote: Message copied from [name] she sent to her pcp- Marked as Urgent!   Hi! I have a question. I had my last menstrual period on November 1st. I am on month two of my birth control called Slynd. Last night I began to have bad cramping similar to when I'm on my period, I would rate this pain a 9/10 last night and a 6/10 this morning. I called my OB/GYN office (I see one out of [facility]), and they told me that I needed to get an ultrasound today, but I could not get into their office today. I am wondering if there's any way I could possibly get it done through you? I do not wish to go to the emergency department because I don't feel like my symptoms are severe enough for that. Please get back to me as soon as you can, thank you for your time! I am also experiencing vaginal bleeding. >> Jan 31, 2024  1:39 PM [name] wrote: 2nd attempt by patient who wishes to speak with someone from Dr. Manette office.  She has not heard from anyone yet and does not want to speak to nurse triage.

## 2024-02-03 NOTE — Telephone Encounter (Signed)
 Recommend that she see GYN if she is able to get appt. Otherwise, can find acute appt with any provider in our office

## 2024-02-04 NOTE — Telephone Encounter (Signed)
 Per mychart encounter from 01/31/24 (shown below) patient was advised to be seen in office or at [facility] if she can not be seen by GYN soon. Please advise  Me to [name]     01/31/24  4:31 PM Hi Erica,   Dr.B is not in office today but I did share your concerns with another provider here in office. She is recommending you be seen here in office or urgent care.   Last read by Erica Scott at 4:32PM on 01/31/2024.

## 2024-02-04 NOTE — Telephone Encounter (Signed)
 Duplicate encounter. Adding to original

## 2024-02-04 NOTE — Telephone Encounter (Signed)
 Copied from CRM #[id number]. Topic: Clinical - Red Word Triage >> Jan 31, 2024 11:51 AM [name] wrote: Message copied from [name] she sent to her pcp- Marked as Urgent!   Hi! I have a question. I had my last menstrual period on November 1st. I am on month two of my birth control called Slynd. Last night I began to have bad cramping similar to when I'm on my period, I would rate this pain a 9/10 last night and a 6/10 this morning. I called my OB/GYN office (I see one out of [facility]), and they told me that I needed to get an ultrasound today, but I could not get into their office today. I am wondering if there's any way I could possibly get it done through you? I do not wish to go to the emergency department because I don't feel like my symptoms are severe enough for that. Please get back to me as soon as you can, thank you for your time! I am also experiencing vaginal bleeding. >> Jan 31, 2024  1:39 PM [name] wrote: 2nd attempt by patient who wishes to speak with someone from Dr. Manette office.  She has not heard from anyone yet and does not want to speak to nurse triage.

## 2024-02-15 ENCOUNTER — Other Ambulatory Visit: Payer: Self-pay | Admitting: Family Medicine

## 2024-02-17 ENCOUNTER — Ambulatory Visit: Admitting: Cardiology

## 2024-02-17 NOTE — Telephone Encounter (Signed)
 LOV- 11/21/2023 NOV- None LRF- 10/28/2023 Outpatient Medication Detail   Disp Refills Start End   sertraline  (ZOLOFT ) 100 MG tablet 45 tablet 3 10/28/2023 --   Sig - Route: Take 1.5 tablets (150 mg total) by mouth daily. - Oral   Sent to pharmacy as: sertraline  (ZOLOFT ) 100 MG tablet   E-Prescribing Status: Receipt confirmed by pharmacy (10/28/2023  3:56 PM EDT)

## 2024-02-24 ENCOUNTER — Ambulatory Visit: Admitting: Cardiology

## 2024-03-09 ENCOUNTER — Encounter: Payer: Self-pay | Admitting: Cardiology

## 2024-03-09 ENCOUNTER — Ambulatory Visit: Attending: Cardiology | Admitting: Cardiology

## 2024-03-09 ENCOUNTER — Telehealth: Payer: Self-pay

## 2024-03-09 ENCOUNTER — Other Ambulatory Visit (HOSPITAL_COMMUNITY): Payer: Self-pay

## 2024-03-09 VITALS — BP 90/57 | HR 92 | Ht 67.0 in | Wt 206.0 lb

## 2024-03-09 DIAGNOSIS — R42 Dizziness and giddiness: Secondary | ICD-10-CM

## 2024-03-09 DIAGNOSIS — I9589 Other hypotension: Secondary | ICD-10-CM

## 2024-03-09 MED ORDER — SODIUM CHLORIDE 1 G PO TABS: 1.0000 g | ORAL_TABLET | Freq: Three times a day (TID) | ORAL | 3 refills | Status: AC

## 2024-03-09 NOTE — Progress Notes (Signed)
 " Cardiology Office Note:    Date:  03/09/2024   ID:  Erica Scott, DOB [birth year], MRN [id number]  PCP:  Myrla Jon HERO, MD  Cardiologist:  None  Electrophysiologist:  None   Referring MD: Myrla Jon HERO, MD   Chief Complaint  Patient presents with   Follow-up    New pt has been doing well with no complaints of chest pain, chest pressure or SOB, medciation reviewed verbally with patient. Yesterday was bending down and dizzy when she stood up, feels dizzy a lot    Erica Scott is a 23 y.o. female who is being seen today for the evaluation of dizziness at the request of Bacigalupo, Jon HERO, MD.   History of Present Illness:    Erica Scott is a 22 y.o. female with history of anxiety presenting with dizziness.  Patient states having symptoms of dizziness ongoing over the past several months.  Symptoms typically occur when standing from a seated position.  Denies palpitations, denies syncope.  Has noticed BP drop to systolic of 90s when moving from sitting to standing.  Average BP at home is in the 120s.  Otherwise doing okay.  Denies chest pain or shortness of breath.  States being fairly active.   Historical notes She was evaluated by cardiology ER [facility] on 05/27/2019 for chest discomfort.  EKG showed sinus tachycardia without acute ST-T changes, echocardiogram on 11/2018 showed normal EF with LVEF overall 55%, no valvular abnormalities, repeat echocardiogram on 03/2019 showed normal EF, normal wall motion normal chamber sizes no valvular insufficiency or stenosis.  1 week cardiac monitor on 11/27/2018 with no significant arrhythmias.  She describes some symptoms of pleuritic chest pain, echocardiogram did not reveal any effusion, sed rate was also negative.  She is also seen GI for possible GI causes of chest pain where work-up via barium swallow was negative.  She has some modest improvement with ibuprofen .  Her symptoms were not deemed cardiac  etiology and further diagnostic testing was not recommended.  Did not perform CMR due to claustrophobia    Past Medical History:  Diagnosis Date   Allergy    Anxiety    Chronic tonsillitis    Depression     Past Surgical History:  Procedure Laterality Date   TONSILLECTOMY AND ADENOIDECTOMY N/A 10/25/2017   Procedure: TONSILLECTOMY AND ADENOIDECTOMY;  Surgeon: Herminio Miu, MD;  Location: [facility];  Service: ENT;  Laterality: N/A;   WISDOM TOOTH EXTRACTION      Current Medications: Current Meds  Medication Sig   fexofenadine  (ALLEGRA ) 180 MG tablet Take 1 tablet (180 mg total) by mouth daily.   fluticasone (FLONASE) 50 MCG/ACT nasal spray Place 1 spray into both nostrils 2 (two) times daily.   montelukast  (SINGULAIR ) 10 MG tablet Take 1 tablet (10 mg total) by mouth at bedtime.   sertraline  (ZOLOFT ) 100 MG tablet TAKE 1.5 TABLETS (150MG  TOTAL) BY MOUTH DAILY   sodium chloride  1 g tablet Take 1 tablet (1 g total) by mouth 3 (three) times daily.     Allergies:   Keflex [cephalexin]   Social History   Socioeconomic History   Marital status: Single    Spouse name: Not on file   Number of children: Not on file   Years of education: Not on file   Highest education level: Some college, no degree  Occupational History   Not on file  Tobacco Use   Smoking status: Never   Smokeless tobacco: Never  Vaping Use  Vaping status: Never Used  Substance and Sexual Activity   Alcohol use: Never   Drug use: Never   Sexual activity: Never    Birth control/protection: Abstinence  Other Topics Concern   Not on file  Social History Narrative   Not on file   Social Drivers of Health   Tobacco Use: Low Risk (03/09/2024)   Patient History    Smoking Tobacco Use: Never    Smokeless Tobacco Use: Never    Passive Exposure: Not on file  Financial Resource Strain: Low Risk (01/28/2024)   Overall Financial Resource Strain (CARDIA)    Difficulty of Paying Living Expenses:  Not hard at all  Food Insecurity: No Food Insecurity (01/28/2024)   Epic    Worried About Programme Researcher, Broadcasting/film/video in the Last Year: Never true    Ran Out of Food in the Last Year: Never true  Transportation Needs: No Transportation Needs (01/28/2024)   Epic    Lack of Transportation (Medical): No    Lack of Transportation (Non-Medical): No  Physical Activity: Sufficiently Active (01/28/2024)   Exercise Vital Sign    Days of Exercise per Week: 5 days    Minutes of Exercise per Session: 30 min  Stress: No Stress Concern Present (01/28/2024)   [employer] of Occupational Health - Occupational Stress Questionnaire    Feeling of Stress: Not at all  Social Connections: Moderately Isolated (01/28/2024)   Social Connection and Isolation Panel    Frequency of Communication with Friends and Family: More than three times a week    Frequency of Social Gatherings with Friends and Family: More than three times a week    Attends Religious Services: More than 4 times per year    Active Member of [employer] or Organizations: No    Attends Banker Meetings: Not on file    Marital Status: Never married  Depression (PHQ2-9): Low Risk (10/28/2023)   Depression (PHQ2-9)    PHQ-2 Score: 0  Alcohol Screen: Low Risk (10/28/2023)   Alcohol Screen    Last Alcohol Screening Score (AUDIT): 0  Housing: Unknown (01/28/2024)   Epic    Unable to Pay for Housing in the Last Year: No    Number of Times Moved in the Last Year: Not on file    Homeless in the Last Year: No  Utilities: Not At Risk (10/28/2023)   Epic    Threatened with loss of utilities: No  Health Literacy: Adequate Health Literacy (10/28/2023)   B1300 Health Literacy    Frequency of need for help with medical instructions: Never     Family History: The patient's family history includes Heart attack in her maternal grandfather; Heart disease in her maternal grandfather and paternal grandmother; Hyperlipidemia in her brother and father;  Hypertension in her father. There is no history of Prostate cancer, Bladder Cancer, or Kidney cancer.  ROS:   Please see the history of present illness.     All other systems reviewed and are negative.  EKGs/Labs/Other Studies Reviewed:    The following studies were reviewed today:   EKG Interpretation Date/Time:  Monday March 09 2024 09:22:27 EST Ventricular Rate:  92 PR Interval:  152 QRS Duration:  78 QT Interval:  380 QTC Calculation: 469 R Axis:   14  Text Interpretation: Normal sinus rhythm Normal ECG Confirmed by Darliss Rogue ([id number]) on 03/09/2024 9:30:55 AM    Recent Labs: 09/17/2023: BUN 8; Creatinine, Ser 0.76; Hemoglobin 13.6; Platelets 413; Potassium 4.5; Sodium 141  Recent  Lipid Panel    Component Value Date/Time   CHOL 150 10/29/2022 1456   TRIG 64 10/29/2022 1456   HDL 51 10/29/2022 1456   CHOLHDL 2.9 10/29/2022 1456   LDLCALC 86 10/29/2022 1456    Physical Exam:    VS:  BP (!) 90/57 (BP Location: Left Arm, Patient Position: Sitting, Cuff Size: Normal)   Pulse 92   Ht 5' 7 (1.702 m)   Wt 206 lb (93.4 kg)   SpO2 98%   BMI 32.26 kg/m     Wt Readings from Last 3 Encounters:  03/09/24 206 lb (93.4 kg)  01/29/24 198 lb 6.4 oz (90 kg)  11/21/23 193 lb 3.2 oz (87.6 kg)     GEN:  Well nourished, well developed in no acute distress HEENT: Normal NECK: No JVD; No carotid bruits LYMPHATICS: No lymphadenopathy CARDIAC: Tachycardic, no murmurs, rubs, gallops RESPIRATORY:  Clear to auscultation without rales, wheezing or rhonchi  ABDOMEN: Soft, non-tender, non-distended MUSCULOSKELETAL:  No edema; No deformity  SKIN: Warm and dry NEUROLOGIC:  Alert and oriented x 3 PSYCHIATRIC:  Normal affect   ASSESSMENT:    1. Other specified hypotension   2. Dizziness     PLAN:    In order of problems listed above:  Hypotension, dizziness.  BP today low, controlled at home on average.  Vertigo, dehydration versus orthostasis possibly contributing.   Orthostatic vitals today with no evidence for orthostasis.  Adequate hydration advised, okay to start salt tablets.  Check BP at home monitor symptoms.  Previous cardiac workup with echo, cardiac monitor was unrevealing.  Patient is overall low risk for cardiac disease.  Follow-up in 3 months  This note was generated in part or whole with voice recognition software. Voice recognition is usually quite accurate but there are transcription errors that can and very often do occur. I apologize for any typographical errors that were not detected and corrected.  Medication Adjustments/Labs and Tests Ordered: Current medicines are reviewed at length with the patient today.  Concerns regarding medicines are outlined above.  Orders Placed This Encounter  Procedures   EKG 12-Lead   Meds ordered this encounter  Medications   sodium chloride  1 g tablet    Sig: Take 1 tablet (1 g total) by mouth 3 (three) times daily.    Dispense:  180 tablet    Refill:  3    Patient Instructions  Medication Instructions:  - START salt tabs 1000 mg three times a day   *If you need a refill on your cardiac medications before your next appointment, please call your pharmacy*  Lab Work: No labs ordered today  If you have labs (blood work) drawn today and your tests are completely normal, you will receive your results only by: MyChart Message (if you have MyChart) OR A paper copy in the mail If you have any lab test that is abnormal or we need to change your treatment, we will call you to review the results.  Testing/Procedures: No test ordered today   Follow-Up: At [facility], you and your health needs are our priority.  As part of our continuing mission to provide you with exceptional heart care, our providers are all part of one team.  This team includes your primary Cardiologist (physician) and Advanced Practice Providers or APPs (Physician Assistants and Nurse Practitioners) who all work together  to provide you with the care you need, when you need it.  Your next appointment:   3 month(s)  Provider:  You may see Dr Darliss or one of the following Advanced Practice Providers on your designated Care Team:   Lonni Meager, NP Lesley Maffucci, PA-C Bernardino Bring, PA-C Cadence Richland, PA-C Tylene Lunch, NP Barnie Hila, NP    We recommend signing up for the patient portal called MyChart.  Sign up information is provided on this After Visit Summary.  MyChart is used to connect with patients for Virtual Visits (Telemedicine).  Patients are able to view lab/test results, encounter notes, upcoming appointments, etc.  Non-urgent messages can be sent to your provider as well.   To learn more about what you can do with MyChart, go to [email or web address].             Signed, Redell Darliss, MD  03/09/2024 10:27 AM    [facility] "

## 2024-03-09 NOTE — Patient Instructions (Signed)
 Medication Instructions:  - START salt tabs 1000 mg three times a day   *If you need a refill on your cardiac medications before your next appointment, please call your pharmacy*  Lab Work: No labs ordered today  If you have labs (blood work) drawn today and your tests are completely normal, you will receive your results only by: MyChart Message (if you have MyChart) OR A paper copy in the mail If you have any lab test that is abnormal or we need to change your treatment, we will call you to review the results.  Testing/Procedures: No test ordered today   Follow-Up: At [facility], you and your health needs are our priority.  As part of our continuing mission to provide you with exceptional heart care, our providers are all part of one team.  This team includes your primary Cardiologist (physician) and Advanced Practice Providers or APPs (Physician Assistants and Nurse Practitioners) who all work together to provide you with the care you need, when you need it.  Your next appointment:   3 month(s)  Provider:   You may see Dr Darliss or one of the following Advanced Practice Providers on your designated Care Team:   Lonni Meager, NP Lesley Maffucci, PA-C Bernardino Bring, PA-C Cadence Wapella, PA-C Tylene Lunch, NP Barnie Hila, NP    We recommend signing up for the patient portal called MyChart.  Sign up information is provided on this After Visit Summary.  MyChart is used to connect with patients for Virtual Visits (Telemedicine).  Patients are able to view lab/test results, encounter notes, upcoming appointments, etc.  Non-urgent messages can be sent to your provider as well.   To learn more about what you can do with MyChart, go to [email or web address].

## 2024-03-09 NOTE — Telephone Encounter (Signed)
 Me to Brien Salm, RN    03/09/24  1:36 PM Note [facility] is closed for lunch but left VM requesting details on cash price because over $300 for an OTC doesn't make a lot of sense to me. Waiting to hear back from their [facility] to see what the cash price issue is. But please be advised that the [facility] is [facility] for less than $20 for 1 month in the meantime, we just need an RX.      Brien Salm, RN to [employer]     03/09/24  1:31 PM Drug isn't considered a medication on plan, it's OTC. Plan will not cover OTC medications. Patient is expected to pay cash price.   NaCl should be fairly cheap at most pharmacies. Our cash price for 1 month is $17.56. 90 qty TID for 30 days.       You can ask your pharmacy if you can purchase this medication over the counter if you are interested.   Last read by Rosina Spare at 1:31PM on 03/09/2024.  Me to Brien Salm, RN    03/09/24  1:28 PM Note Drug isn't considered a medication on plan, it's OTC. Plan will not cover OTC medications. Patient is expected to pay cash price.   NaCl should be fairly cheap at most pharmacies. Our cash price for 1 month is $17.56. 90 qty TID for 30 days.       Brien Salm, RN to [facility] [employer]  Rx Med Assistance Team     03/09/24  1:16 PM Please advise :)  Brien, Summer, RN to [employer]     03/09/24  1:16 PM Hey there! Ill forward this over to our prior auth team to see if they can help out.   Thanks!  Last read by Rosina Spare at 1:31PM on 03/09/2024.  Rosina Whan to [facility] Triage (supporting Redell Cave, MD)     03/09/24  1:13 PM Hi! I was in today and was prescribed sodium chloride  1 g tablets to my pharmacy. They are telling me its not covered by insurance and would be $318. I was wondering if theres anything that you could do to get it covered? Maybe prior authorization or something? Or if you have a recommendation for some  over-the-counter brands I can look for! Thank you!

## 2024-03-09 NOTE — Telephone Encounter (Signed)
 Drug isn't considered a medication on plan, it's OTC. Plan will not cover OTC medications. Patient is expected to pay cash price.   NaCl should be fairly cheap at most pharmacies. Our cash price for 1 month is $17.56. 90 qty TID for 30 days.

## 2024-03-09 NOTE — Telephone Encounter (Signed)
 [facility] closed [facility] but left VM requesting details on cash price because over $300 for an OTC doesn't make a lot of sense to me. Waiting to hear back from their [facility] to see what the cash price issue is. But please be advised that the [facility] is [facility] fill [facility] for less than $20 for 1 month in the meantime, we just need an RX.

## 2024-03-10 ENCOUNTER — Other Ambulatory Visit (HOSPITAL_COMMUNITY): Payer: Self-pay

## 2024-03-10 NOTE — Telephone Encounter (Signed)
 Had not received a callback from [facility]. Followed up again this morning. [facility] was able to run it for less than $25 for 2 months. Please note, RX was sent in for a 60 day supply so patient does not have any fill remaining. [facility] will have the 60 day supply ready shortly.

## 2024-03-16 ENCOUNTER — Encounter: Payer: Self-pay | Admitting: Family Medicine

## 2024-03-16 NOTE — Telephone Encounter (Signed)
 It doesn't look like anything else is available tomorrow. We could make it virtual (and then we could leave it as scheduled but connect at 8am if it is virtual so we can talk before she goes to sleep if she wants).

## 2024-03-17 ENCOUNTER — Ambulatory Visit: Admitting: Family Medicine

## 2024-03-17 ENCOUNTER — Encounter: Payer: Self-pay | Admitting: Family Medicine

## 2024-03-17 ENCOUNTER — Ambulatory Visit: Payer: Self-pay

## 2024-03-17 VITALS — BP 109/74 | HR 87 | Resp 14 | Ht 67.0 in | Wt 206.3 lb

## 2024-03-17 DIAGNOSIS — J329 Chronic sinusitis, unspecified: Secondary | ICD-10-CM | POA: Diagnosis not present

## 2024-03-17 MED ORDER — AMOXICILLIN-POT CLAVULANATE 875-125 MG PO TABS: 1.0000 | ORAL_TABLET | Freq: Two times a day (BID) | ORAL | 0 refills | Status: AC

## 2024-03-17 NOTE — Progress Notes (Signed)
 "     Acute visit   Patient: Erica Scott   DOB: November 15, 2000   23 y.o. Female  MRN: [id number] PCP: Myrla Jon HERO, MD   Chief Complaint  Patient presents with   Acute Visit    Worsening headaches, sinus pain x 1 wk otc: ibuprofen , tylenol , sudafed   Subjective    Discussed the use of AI scribe software for clinical note transcription with the patient, who gave verbal consent to proceed.  History of Present Illness   Erica Scott is a 23 year old female who presents with sinus pain and headaches for a week.  She reports worsening sinus pain and headaches over the past week, similar to prior sinus infections but without fever. Tylenol , ibuprofen , Sudafed, saline nasal spray, and Flonase have not helped, and Tylenol  is less effective than usual for her headaches.  She has had multiple sinus infections this year, including in November and April, with more episodes than prior years. She had Eustachian tube problems in September that were not related to a sinus infection.  She uses daily allergy medications, including Singulair , but still has recurrent sinus issues.  She has had severe stomach pain with doxycycline  and rashes on her hands with Keflex. She takes probiotics and asks about using Imodium to limit antibiotic-related gastrointestinal side effects.  She denies fevers or recent cold or other acute illness.       Review of Systems   Objective    BP 109/74   Pulse 87   Resp 14   Ht 5' 7 (1.702 m)   Wt 206 lb 4.8 oz (93.6 kg)   LMP 03/09/2024   SpO2 100%   BMI 32.31 kg/m   Physical Exam Vitals reviewed.  Constitutional:      General: She is not in acute distress.    Appearance: Normal appearance. She is well-developed. She is not diaphoretic.  HENT:     Head: Normocephalic and atraumatic.     Right Ear: Tympanic membrane, ear canal and external ear normal.     Left Ear: Tympanic membrane, ear canal and external ear normal.     Nose: Congestion  present.     Comments: Sinus TTP    Mouth/Throat:     Mouth: Mucous membranes are moist.     Pharynx: Oropharynx is clear. No oropharyngeal exudate.  Eyes:     General: No scleral icterus.    Conjunctiva/sclera: Conjunctivae normal.     Pupils: Pupils are equal, round, and reactive to light.  Cardiovascular:     Rate and Rhythm: Normal rate and regular rhythm.     Pulses: Normal pulses.     Heart sounds: Normal heart sounds. No murmur heard. Pulmonary:     Effort: Pulmonary effort is normal. No respiratory distress.     Breath sounds: Normal breath sounds. No wheezing or rales.  Musculoskeletal:     Cervical back: Neck supple.     Right lower leg: No edema.     Left lower leg: No edema.  Lymphadenopathy:     Cervical: No cervical adenopathy.  Skin:    General: Skin is warm and dry.     Findings: No rash.  Neurological:     Mental Status: She is alert.       No results found for any visits on 03/17/24.  Assessment & Plan     Problem List Items Addressed This Visit       Respiratory   Recurrent sinusitis - Primary   Relevant  Medications   amoxicillin -clavulanate (AUGMENTIN ) 875-125 MG tablet        Recurrent sinusitis Sinus pain and headaches for one week, worsening. No fever. Previous sinus infections in November, December, and April. Current symptoms suggest sinus infection. Previous treatment with Augmentin . Doxycycline  not tolerated due to severe stomach pain. Septimere and Keflex cause rashes. Augmentin  chosen despite recent use due to intolerance to other antibiotics. Discussed potential gastrointestinal side effects of Augmentin  and management with Imodium and probiotics. - Prescribed Augmentin  twice daily for one week. - Advised taking Imodium for gastrointestinal side effects. - Recommended taking probiotics. - [facility] [place]. - Monitor for recurrence of sinusitis; will consider ENT referral if another episode occurs  within six months.       Meds ordered this encounter  Medications   amoxicillin -clavulanate (AUGMENTIN ) 875-125 MG tablet    Sig: Take 1 tablet by mouth 2 (two) times daily for 7 days.    Dispense:  14 tablet    Refill:  0     Return if symptoms worsen or fail to improve.      Jon Eva, MD  [facility] [phone] (phone) [phone] (fax)   [facility]  "

## 2024-03-24 ENCOUNTER — Ambulatory Visit: Admitting: Family Medicine

## 2024-03-31 ENCOUNTER — Ambulatory Visit: Admitting: Family Medicine

## 2024-04-01 ENCOUNTER — Encounter: Payer: Self-pay | Admitting: Family Medicine

## 2024-04-02 ENCOUNTER — Encounter: Payer: Self-pay | Admitting: Family Medicine

## 2024-04-02 ENCOUNTER — Ambulatory Visit: Admitting: Family Medicine

## 2024-04-02 ENCOUNTER — Ambulatory Visit: Admitting: Nurse Practitioner

## 2024-04-02 VITALS — BP 130/73 | HR 102 | Ht 67.0 in | Wt 206.7 lb

## 2024-04-02 DIAGNOSIS — N750 Cyst of Bartholin's gland: Secondary | ICD-10-CM | POA: Diagnosis not present

## 2024-04-02 MED ORDER — DOXYCYCLINE HYCLATE 100 MG PO TABS: 100.0000 mg | ORAL_TABLET | Freq: Two times a day (BID) | ORAL | 0 refills | Status: AC

## 2024-04-02 NOTE — Progress Notes (Signed)
 "     Acute visit   Patient: Erica Scott   DOB: [birth year]   24 y.o. Female  MRN: [id number] PCP: Myrla Jon HERO, MD   Chief Complaint  Patient presents with   Acute Visit    Vulvar pain X Monday. She reports it has worsened. Associated with redness and swelling. Never sexually active. No discharge. LMP Dec 22nd.    Subjective    Discussed the use of AI scribe software for clinical note transcription with the patient, who gave verbal consent to proceed.  History of Present Illness   Erica Scott is a 24 year old female who presents with vulvar pain and swelling.  She noticed a tender bump under the skin near the vaginal opening on Monday that enlarged from pea-sized to grape-sized by Tuesday, with additional smaller pea-sized subcutaneous bumps on both sides. Pain is significant and constant, making it hard for her to sit, walk, and wipe after urination. Warm compresses give only temporary relief. She notes no drainage and describes the area as sometimes feeling hot. She has not taken any medications for this. She has never been sexually active and is uncertain why this is occurring.        Review of Systems  Objective    BP 130/73 (BP Location: Left Arm, Patient Position: Sitting, Cuff Size: Large)   Pulse (!) 102   Ht 5' 7 (1.702 m)   Wt 206 lb 11.2 oz (93.8 kg)   LMP 03/09/2024   SpO2 100%   BMI 32.37 kg/m  Physical Exam Vitals reviewed.  Constitutional:      General: She is not in acute distress.    Appearance: She is well-developed.  HENT:     Head: Normocephalic and atraumatic.  Eyes:     General: No scleral icterus.    Conjunctiva/sclera: Conjunctivae normal.  Pulmonary:     Effort: Pulmonary effort is normal. No respiratory distress.  Genitourinary:    Comments: Enlarged cyst R lower labia. Slight TTP, no erythema, no drainage. Skin:    General: Skin is warm and dry.     Findings: No rash.  Neurological:     Mental Status: She is alert  and oriented to person, place, and time.  Psychiatric:        Behavior: Behavior normal.       No results found for any visits on 04/02/24.  Assessment & Plan     Problem List Items Addressed This Visit   None Visit Diagnoses       Bartholin's cyst    -  Primary   Relevant Orders   Ambulatory referral to Gynecology          Bartholin's gland cyst Acute Bartholin's gland cyst with reactive swelling, presenting as a pea-sized bump near the vaginal opening, increasing to grape size, with smaller pea-sized bumps bilaterally. Painful with sitting, walking, and urination. No drainage observed. No signs of infection, but inflammation present. Differential includes abscess or cyst. Condition is not related to sexual activity and is common in females. - Apply warm compresses and sitz baths to promote drainage. - Referred to gynecology for potential incision and drainage if cyst does not resolve. - Prescribed doxycycline  to be filled at pharmacy if symptoms worsen or signs of infection develop. - Provided information about Bartholin's gland cyst via MyChart. - Advised to monitor for increased redness, swelling, or tenderness and to contact if these occur.       Meds ordered this encounter  Medications  doxycycline  (VIBRA -TABS) 100 MG tablet    Sig: Take 1 tablet (100 mg total) by mouth 2 (two) times daily for 7 days.    Dispense:  14 tablet    Refill:  0     Return if symptoms worsen or fail to improve.      Jon Eva, MD  [facility] [phone] (phone) [phone] (fax)  [facility]  "

## 2024-04-02 NOTE — Progress Notes (Deleted)
 Erica Scott

## 2024-04-20 ENCOUNTER — Ambulatory Visit: Admitting: Family Medicine

## 2024-04-23 ENCOUNTER — Ambulatory Visit: Admitting: Family Medicine

## 2024-04-23 ENCOUNTER — Encounter: Payer: Self-pay | Admitting: Family Medicine

## 2024-04-23 VITALS — BP 122/61 | HR 98 | Resp 16 | Ht 67.0 in | Wt 206.2 lb

## 2024-04-23 DIAGNOSIS — E66811 Obesity, class 1: Secondary | ICD-10-CM

## 2024-04-23 DIAGNOSIS — R5382 Chronic fatigue, unspecified: Secondary | ICD-10-CM

## 2024-04-23 DIAGNOSIS — Z6832 Body mass index (BMI) 32.0-32.9, adult: Secondary | ICD-10-CM | POA: Insufficient documentation

## 2024-04-23 DIAGNOSIS — R300 Dysuria: Secondary | ICD-10-CM

## 2024-04-23 DIAGNOSIS — R635 Abnormal weight gain: Secondary | ICD-10-CM

## 2024-04-23 DIAGNOSIS — R350 Frequency of micturition: Secondary | ICD-10-CM

## 2024-04-23 LAB — POCT URINALYSIS DIPSTICK
Bilirubin, UA: NEGATIVE
Blood, UA: NEGATIVE
Glucose, UA: NEGATIVE
Ketones, UA: NEGATIVE
Leukocytes, UA: NEGATIVE
Nitrite, UA: NEGATIVE
Protein, UA: NEGATIVE
Spec Grav, UA: 1.015
Urobilinogen, UA: 0.2 U/dL
pH, UA: 6

## 2024-04-23 NOTE — Progress Notes (Signed)
 "  Established Patient Office Visit  Subjective   Patient ID: Erica Scott, female    DOB: 09-Feb-2001  Age: 24 y.o. MRN: [id number]  Chief Complaint  Patient presents with   Acute Visit    Came off birth control last November since then has had issues losing weight, very fatigue, would like to check for any thyroid  issues.   Urinary Tract Infection    Painful urination with frequency, lower back pain and abdomen pressure x 3 days    Ashely Scott is a 24 y/o presenting today for concerns with weight gain, fatigue, and dysuria. She has noticed weight gain while taking birth control but has struggled to see weight loss since discontinuing medication in Nov. She has been maintaining a calorie deficit and exercising regularly. She endorses cold intolerance, dry skin and brittle nails. She has also had 3 days of pain with urination, increased frequency, and low back pain.        Review of Systems  Constitutional:  Positive for malaise/fatigue. Negative for fever.  Cardiovascular:  Negative for palpitations.  Genitourinary:  Positive for dysuria, flank pain and frequency.  Neurological:  Positive for headaches.      Objective:     BP 122/61   Pulse 98   Resp 16   Ht 5' 7 (1.702 m)   Wt 206 lb 3.2 oz (93.5 kg)   LMP 04/05/2024   SpO2 100%   BMI 32.30 kg/m    Physical Exam Constitutional:      Appearance: Normal appearance.  Cardiovascular:     Rate and Rhythm: Normal rate and regular rhythm.     Pulses: Normal pulses.     Heart sounds: Normal heart sounds.  Pulmonary:     Effort: Pulmonary effort is normal.     Breath sounds: Normal breath sounds.      Results for orders placed or performed in visit on 04/23/24  POCT Urinalysis Dipstick  Result Value Ref Range   Color, UA yellow    Clarity, UA clear    Glucose, UA Negative Negative   Bilirubin, UA Negative    Ketones, UA Negative    Spec Grav, UA 1.015 1.010 - 1.025   Blood, UA Negative    pH, UA  6.0 5.0 - 8.0   Protein, UA Negative Negative   Urobilinogen, UA 0.2 0.2 or 1.0 E.U./dL   Nitrite, UA Negative    Leukocytes, UA Negative Negative   Appearance     Odor        The ASCVD Risk score (Arnett DK, et al., 2019) failed to calculate for the following reasons:   The 2019 ASCVD risk score is only valid for ages 64 to 19   * - Cholesterol units were assumed    Assessment & Plan:   Problem List Items Addressed This Visit       Other   Class 1 obesity without serious comorbidity with body mass index (BMI) of 32.0 to 32.9 in adult   Patient reports continued weight gain despite discontinuing birth control. She has implemented lifestyle changes such as calorie deficit and regular exercise. Experiencing other symptoms concerning for thyroid  dysfunction. Not interested in weight loss medication at this time. Will check thyroid  to rule out as a contributing factor. - Check TSH      Other Visit Diagnoses       Frequent urination    -  Primary   Relevant Orders   POCT Urinalysis Dipstick (Completed)   Urine Culture  Dysuria         Weight gain       Relevant Orders   TSH     Chronic fatigue       Relevant Orders   TSH   VITAMIN D 25 Hydroxy (Vit-D Deficiency, Fractures)   Ferritin      Patient reports 3 days of increased urinary frequency and pain with urination. Urinalysis unremarkable. Will order urine culture and follow-up for further treatment. Discussed avoiding bladder irritants such as caffeine and carbonation as well as increase plain water intake. Also optional to take Azo but importance of hydration while taking medicaiton.  Reporting chronic fatigue. Last CBC within normal limits. Will check vitamin D, ferritin, and TSH to rule out other causes.  No follow-ups on file.    Kamryn J Mills, Medical Student   Patient seen along with [employer] student, Johnsie Barefoot. I personally evaluated this patient along with the student, and verified all aspects of the  history, physical exam, and medical decision making as documented by the student. I agree with the student's documentation and have made all necessary edits.  Murel Wigle, Jon HERO, MD, MPH [facility]    "

## 2024-04-23 NOTE — Assessment & Plan Note (Signed)
 Patient reports continued weight gain despite discontinuing birth control. She has implemented lifestyle changes such as calorie deficit and regular exercise. Experiencing other symptoms concerning for thyroid  dysfunction. Not interested in weight loss medication at this time. Will check thyroid  to rule out as a contributing factor. - Check TSH

## 2024-04-24 ENCOUNTER — Encounter: Payer: Self-pay | Admitting: Family Medicine

## 2024-04-24 LAB — VITAMIN D 25 HYDROXY (VIT D DEFICIENCY, FRACTURES): Vit D, 25-Hydroxy: 9 ng/mL — ABNORMAL LOW (ref 30.0–100.0)

## 2024-04-24 LAB — TSH: TSH: 0.937 u[IU]/mL (ref 0.450–4.500)

## 2024-04-24 LAB — FERRITIN: Ferritin: 26 ng/mL (ref 15–150)

## 2024-05-07 ENCOUNTER — Ambulatory Visit: Admitting: Family Medicine

## 2024-05-13 ENCOUNTER — Encounter: Admitting: Certified Nurse Midwife

## 2024-06-05 ENCOUNTER — Ambulatory Visit: Admitting: Cardiology
